# Patient Record
Sex: Female | Born: 1960 | Race: White | Hispanic: No | Smoking: Former smoker
Health system: Southern US, Community
[De-identification: ages and names within clinical notes are randomized; demographics above are authoritative.]

## PROBLEM LIST (undated history)

## (undated) DIAGNOSIS — I639 Cerebral infarction, unspecified: Secondary | ICD-10-CM

## (undated) DIAGNOSIS — Z87442 Personal history of urinary calculi: Secondary | ICD-10-CM

## (undated) DIAGNOSIS — G43909 Migraine, unspecified, not intractable, without status migrainosus: Secondary | ICD-10-CM

## (undated) DIAGNOSIS — A159 Respiratory tuberculosis unspecified: Secondary | ICD-10-CM

## (undated) DIAGNOSIS — G4730 Sleep apnea, unspecified: Secondary | ICD-10-CM

## (undated) DIAGNOSIS — F419 Anxiety disorder, unspecified: Secondary | ICD-10-CM

## (undated) DIAGNOSIS — Z800 Family history of malignant neoplasm of digestive organs: Secondary | ICD-10-CM

## (undated) DIAGNOSIS — J189 Pneumonia, unspecified organism: Secondary | ICD-10-CM

## (undated) DIAGNOSIS — M1990 Unspecified osteoarthritis, unspecified site: Secondary | ICD-10-CM

## (undated) DIAGNOSIS — Z803 Family history of malignant neoplasm of breast: Secondary | ICD-10-CM

## (undated) DIAGNOSIS — I10 Essential (primary) hypertension: Secondary | ICD-10-CM

## (undated) DIAGNOSIS — I499 Cardiac arrhythmia, unspecified: Secondary | ICD-10-CM

## (undated) HISTORY — DX: Family history of malignant neoplasm of digestive organs: Z80.0

## (undated) HISTORY — DX: Cerebral infarction, unspecified: I63.9

## (undated) HISTORY — PX: KNEE ARTHROSCOPY: SHX127

## (undated) HISTORY — PX: ABDOMINAL HYSTERECTOMY: SHX81

## (undated) HISTORY — PX: WISDOM TOOTH EXTRACTION: SHX21

## (undated) HISTORY — DX: Family history of malignant neoplasm of breast: Z80.3

## (undated) HISTORY — PX: CYST EXCISION: SHX5701

---

## 1999-03-25 ENCOUNTER — Other Ambulatory Visit: Admission: RE | Admit: 1999-03-25 | Discharge: 1999-03-25 | Payer: Self-pay | Admitting: *Deleted

## 1999-06-02 ENCOUNTER — Other Ambulatory Visit: Admission: RE | Admit: 1999-06-02 | Discharge: 1999-06-02 | Payer: Self-pay | Admitting: *Deleted

## 2000-03-26 ENCOUNTER — Encounter (INDEPENDENT_AMBULATORY_CARE_PROVIDER_SITE_OTHER): Payer: Self-pay | Admitting: Specialist

## 2000-03-26 ENCOUNTER — Other Ambulatory Visit: Admission: RE | Admit: 2000-03-26 | Discharge: 2000-03-26 | Payer: Self-pay | Admitting: *Deleted

## 2000-12-13 ENCOUNTER — Other Ambulatory Visit: Admission: RE | Admit: 2000-12-13 | Discharge: 2000-12-13 | Payer: Self-pay | Admitting: Obstetrics & Gynecology

## 2001-01-04 ENCOUNTER — Observation Stay (HOSPITAL_COMMUNITY): Admission: RE | Admit: 2001-01-04 | Discharge: 2001-01-05 | Payer: Self-pay | Admitting: Obstetrics & Gynecology

## 2001-01-04 ENCOUNTER — Encounter (INDEPENDENT_AMBULATORY_CARE_PROVIDER_SITE_OTHER): Payer: Self-pay | Admitting: Specialist

## 2001-01-21 ENCOUNTER — Inpatient Hospital Stay (HOSPITAL_COMMUNITY): Admission: AD | Admit: 2001-01-21 | Discharge: 2001-01-21 | Payer: Self-pay | Admitting: Obstetrics and Gynecology

## 2001-02-04 ENCOUNTER — Ambulatory Visit (HOSPITAL_COMMUNITY): Admission: RE | Admit: 2001-02-04 | Discharge: 2001-02-04 | Payer: Self-pay | Admitting: Internal Medicine

## 2002-01-02 ENCOUNTER — Ambulatory Visit (HOSPITAL_COMMUNITY): Admission: RE | Admit: 2002-01-02 | Discharge: 2002-01-02 | Payer: Self-pay | Admitting: General Surgery

## 2002-10-03 ENCOUNTER — Ambulatory Visit (HOSPITAL_COMMUNITY): Admission: RE | Admit: 2002-10-03 | Discharge: 2002-10-03 | Payer: Self-pay | Admitting: Internal Medicine

## 2002-10-03 ENCOUNTER — Encounter: Payer: Self-pay | Admitting: Internal Medicine

## 2003-07-11 ENCOUNTER — Ambulatory Visit (HOSPITAL_COMMUNITY): Admission: RE | Admit: 2003-07-11 | Discharge: 2003-07-11 | Payer: Self-pay | Admitting: Family Medicine

## 2003-08-16 ENCOUNTER — Ambulatory Visit (HOSPITAL_COMMUNITY): Admission: RE | Admit: 2003-08-16 | Discharge: 2003-08-16 | Payer: Self-pay | Admitting: Orthopedic Surgery

## 2003-08-22 ENCOUNTER — Encounter (HOSPITAL_COMMUNITY): Admission: RE | Admit: 2003-08-22 | Discharge: 2003-08-31 | Payer: Self-pay | Admitting: Orthopedic Surgery

## 2003-09-04 ENCOUNTER — Encounter (HOSPITAL_COMMUNITY): Admission: RE | Admit: 2003-09-04 | Discharge: 2003-10-04 | Payer: Self-pay | Admitting: Orthopedic Surgery

## 2003-10-05 ENCOUNTER — Encounter (HOSPITAL_COMMUNITY): Admission: RE | Admit: 2003-10-05 | Discharge: 2003-11-04 | Payer: Self-pay | Admitting: Orthopedic Surgery

## 2003-11-05 ENCOUNTER — Encounter (HOSPITAL_COMMUNITY): Admission: RE | Admit: 2003-11-05 | Discharge: 2003-12-05 | Payer: Self-pay | Admitting: Orthopedic Surgery

## 2003-12-06 ENCOUNTER — Encounter (HOSPITAL_COMMUNITY): Admission: RE | Admit: 2003-12-06 | Discharge: 2004-01-05 | Payer: Self-pay | Admitting: Orthopedic Surgery

## 2004-09-03 ENCOUNTER — Encounter (HOSPITAL_COMMUNITY): Admission: RE | Admit: 2004-09-03 | Discharge: 2004-12-02 | Payer: Self-pay | Admitting: Neurology

## 2005-08-15 ENCOUNTER — Emergency Department: Payer: Self-pay | Admitting: Emergency Medicine

## 2005-08-17 ENCOUNTER — Ambulatory Visit: Payer: Self-pay | Admitting: Orthopedic Surgery

## 2005-08-17 ENCOUNTER — Ambulatory Visit (HOSPITAL_COMMUNITY): Admission: RE | Admit: 2005-08-17 | Discharge: 2005-08-17 | Payer: Self-pay | Admitting: Orthopedic Surgery

## 2005-10-02 ENCOUNTER — Encounter (HOSPITAL_COMMUNITY): Admission: RE | Admit: 2005-10-02 | Discharge: 2005-11-01 | Payer: Self-pay | Admitting: Specialist

## 2005-11-02 ENCOUNTER — Encounter (HOSPITAL_COMMUNITY): Admission: RE | Admit: 2005-11-02 | Discharge: 2005-12-02 | Payer: Self-pay | Admitting: Specialist

## 2005-12-03 ENCOUNTER — Encounter (HOSPITAL_COMMUNITY): Admission: RE | Admit: 2005-12-03 | Discharge: 2006-01-02 | Payer: Self-pay | Admitting: Specialist

## 2006-01-05 ENCOUNTER — Encounter (HOSPITAL_COMMUNITY): Admission: RE | Admit: 2006-01-05 | Discharge: 2006-01-28 | Payer: Self-pay | Admitting: Specialist

## 2006-01-29 ENCOUNTER — Encounter (HOSPITAL_COMMUNITY): Admission: RE | Admit: 2006-01-29 | Discharge: 2006-02-28 | Payer: Self-pay | Admitting: Specialist

## 2006-03-02 ENCOUNTER — Encounter (HOSPITAL_COMMUNITY): Admission: RE | Admit: 2006-03-02 | Discharge: 2006-04-01 | Payer: Self-pay | Admitting: Specialist

## 2009-03-05 ENCOUNTER — Ambulatory Visit (HOSPITAL_COMMUNITY): Admission: RE | Admit: 2009-03-05 | Discharge: 2009-03-05 | Payer: Self-pay | Admitting: Internal Medicine

## 2009-07-22 ENCOUNTER — Ambulatory Visit (HOSPITAL_COMMUNITY): Admission: RE | Admit: 2009-07-22 | Discharge: 2009-07-22 | Payer: Self-pay | Admitting: Family Medicine

## 2009-08-09 ENCOUNTER — Ambulatory Visit (HOSPITAL_COMMUNITY): Admission: RE | Admit: 2009-08-09 | Discharge: 2009-08-09 | Payer: Self-pay | Admitting: General Surgery

## 2010-12-11 IMAGING — CR DG ANKLE COMPLETE 3+V*R*
2 series · 2 of 2 positions shown · non-contrast
Comparison: None

CLINICAL DATA: Twisted ankle 3 weeks ago with continued pain and
swelling

RIGHT ANKLE - COMPLETE 3+ VIEW

[view not recorded (1 of 2)]
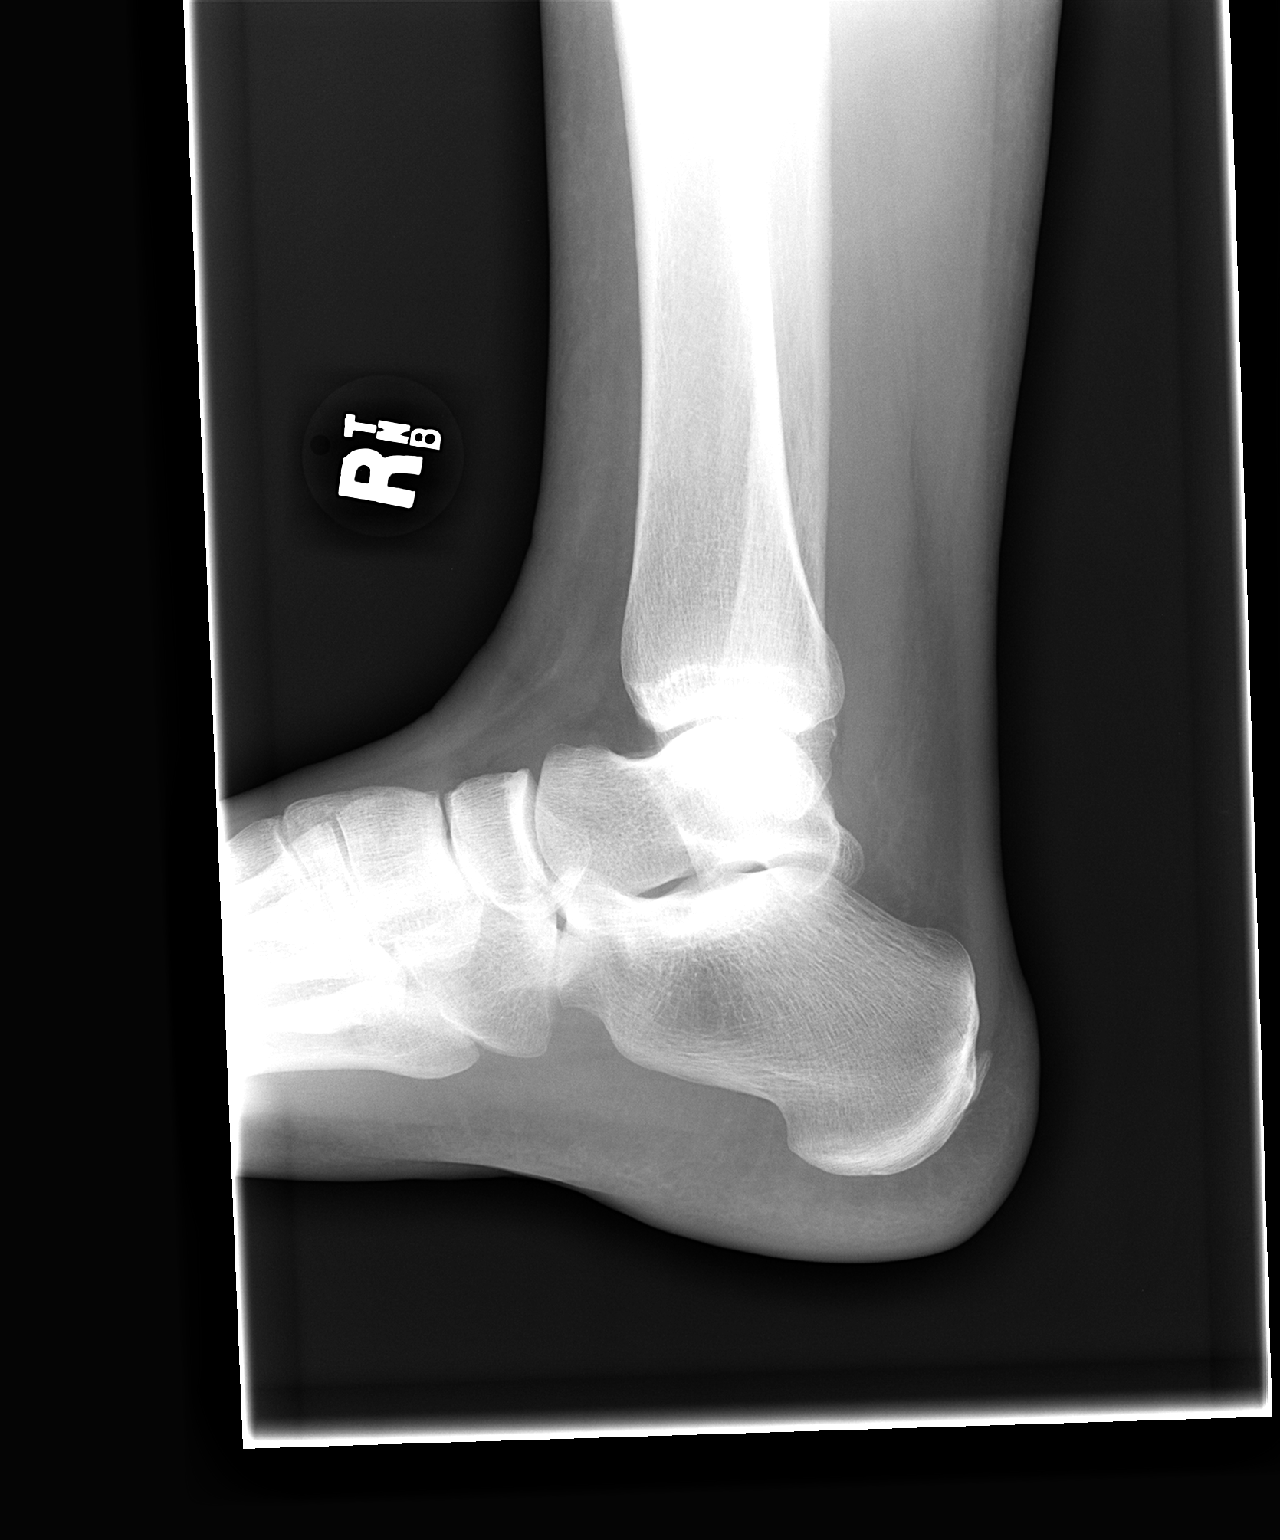

[view not recorded (2 of 2)]
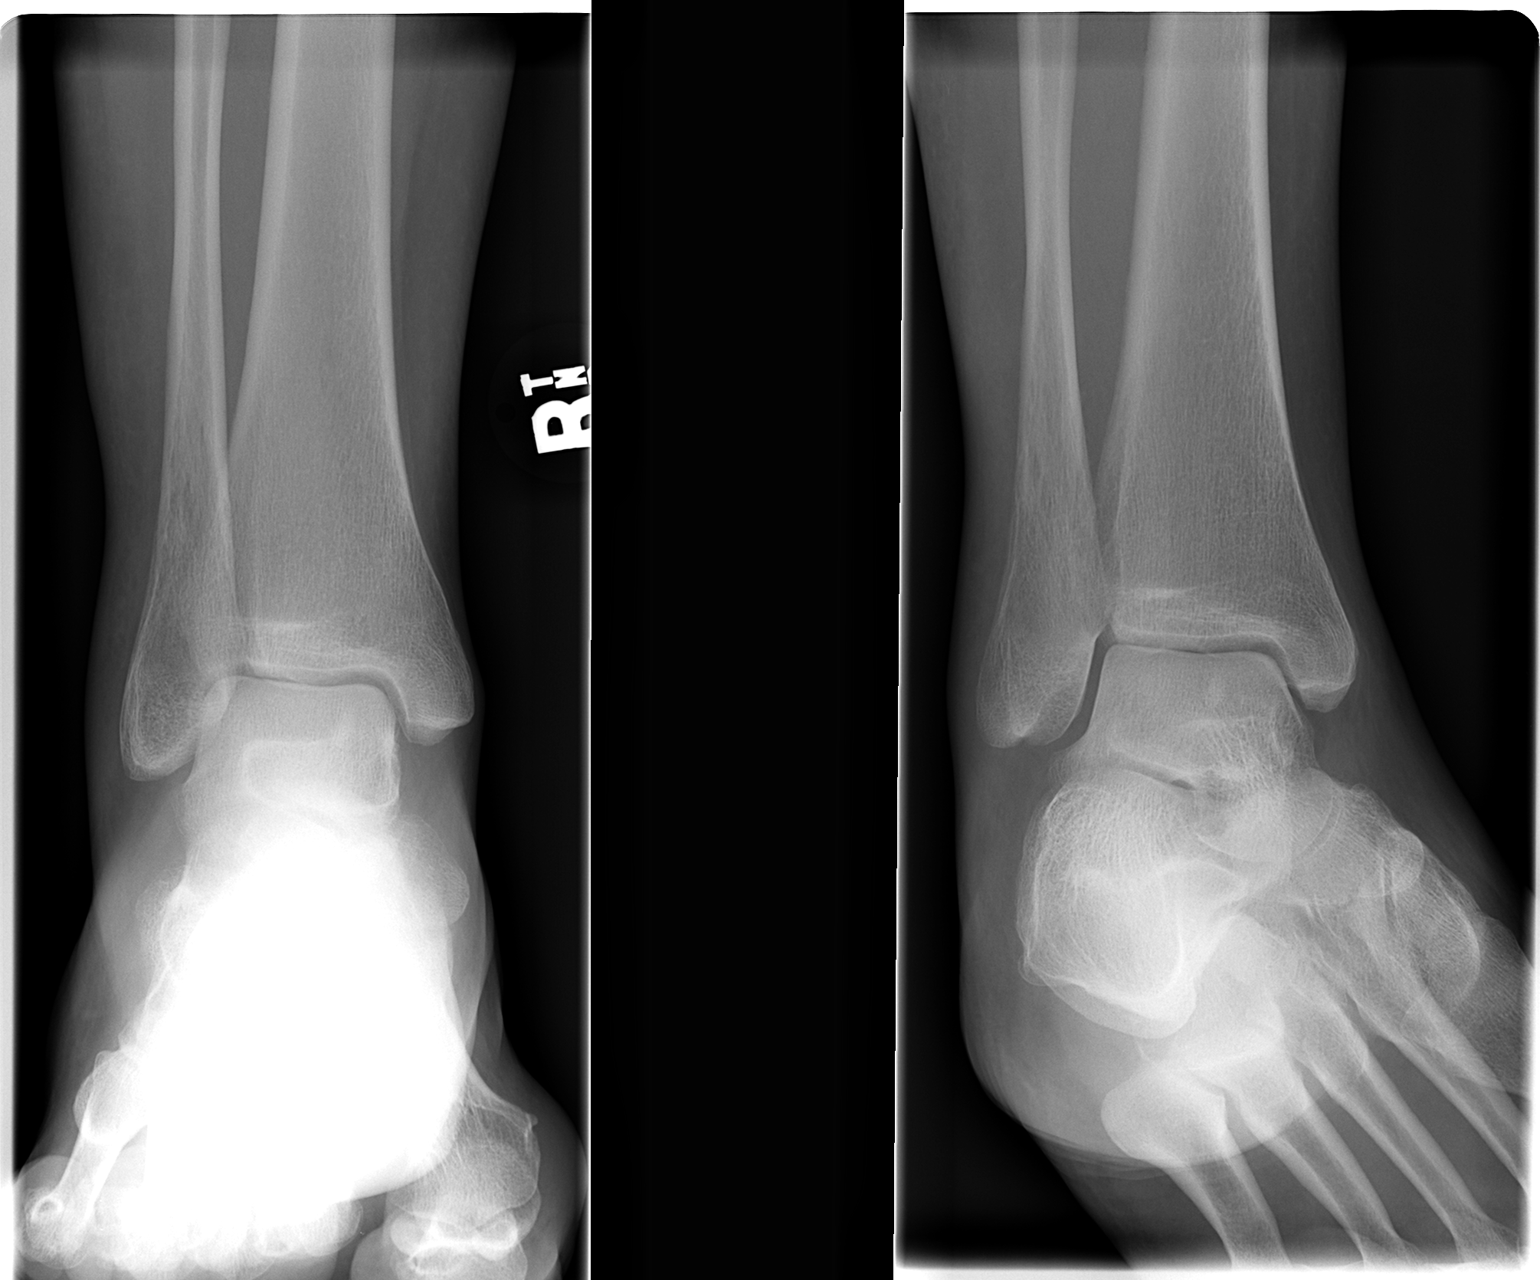

[2 of 2 positions shown; findings below may reference images not displayed]

FINDINGS: The ankle joint appears normal.  No acute fracture is
seen.  Alignment is normal.  No joint effusion is seen.
IMPRESSION: Negative right ankle.

## 2011-01-16 NOTE — Op Note (Signed)
NAME:  CHIAMAKA, LATKA                       ACCOUNT NO.:  [id number]   MEDICAL RECORD NO.:  [id number]                   PATIENT TYPE:  OUT   LOCATION:  RAD                                  FACILITY:  [facility]   PHYSICIAN:  Vickki Hearing, M.D.           DATE OF BIRTH:  [birth year]   DATE OF PROCEDURE:  DATE OF DISCHARGE:  07/11/2003                                 OPERATIVE REPORT   CHIEF COMPLAINT:  Left knee pain.   HISTORY:  Angela Wallace is 50 years old.  She twisted her left knee in late  October.  She complained of medial and posterior knee pain and was treated  conservatively with anti-inflammatories, ice, rest, bracing, mild analgesic,  and had mild improvement.  She had an MRI which showed a grade 2 lesion of  the medial meniscus, grade 3 chondromalacia of the patella, and increased  signal changes in the lateral gastrocs.  After six weeks of conservative  treatment, she has not improved to her premorbid state and has agreed to  arthroscopy of the left knee.   MEDICAL SYSTEM REVIEW:  She had pneumonia in 2003.  She has a history of  migraines.  She wears glasses.  She has seasonal allergies.  Other systems  reviewed general, cardiovascular, gastrointestinal, urinary, endocrine,  psychiatric, skin, lymphatics negative/normal.   ALLERGIES:  CODEINE.   MEDICAL PROBLEMS:  Only listed migraine headaches.   PREVIOUS SURGERY:  Partial hysterectomy, and a cyst was removed as well.   PHARMACY:  [employer] information [employer].   MEDICATIONS:  1. Reglan on a p.r.n. basis.  2. Ambien on a p.r.n. basis.  3. Lorcet on a p.r.n. basis.  4. Hydrochlorothiazide 25 mg daily.  5. Diclofenac twice a day.   FAMILY HISTORY:  Kidney disease, arthritis, and cancer.   FAMILY PHYSICIAN:  Kirk Ruths, M.D.   MARITAL HISTORY:  Married but separated.   EMPLOYMENT:  Customer service.   SOCIAL HABITS:  Smoking:  One-half pack per day.  Alcohol use:  Rare.  Caffeine use:  Yes.   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   PHYSICAL EXAMINATION:  VITAL SIGNS:  Pulse 74, respiratory rate 20, weight  210.  GENERAL:  Well-developed, well-nourished.  Groom and hygiene normal.  NECK:  No deformities or lymph nodes.  Normal cervical spine.  CARDIOVASCULAR:  No swelling or varicose veins.  EXTREMITIES:  Pulses normal, temperature normal.  No edema or tenderness.  NEUROLOGIC/PSYCHIATRIC:  Exam shows normal sensation, coordination, and  reflexes.  She was alert and oriented x3.  Mood and affect was normal.  SKIN:  Normal in all four extremities.  MUSCULOSKELETAL:  Exam showed a mild limp noted in the left lower extremity.  She was wearing a brace.  With the brace removed, she had a small joint  effusion.  Range of motion was 0 to 110.  The ligaments were stable.  The  muscle strength  and tone was normal without tremor.  There were joint  contractures or subluxation.  Her meniscal signs were as follows:  She had  some mild medial joint line tenderness.  She had pain with rotation of the  knee.  Screw-home or extension test was normal.  Her other extremities were  unremarkable.   DATA REVIEWED:  MRI of the left knee.  The findings are noted in the history  of present illness section.   DIAGNOSIS:  Early osteoarthritis of the knee, medial meniscal tear, left  knee; pain, left knee; possible gastroc tear, left knee.   PLAN:  Arthroscopy, medial meniscectomy, chondroplasty as needed, left knee.   CPT CODE:  16109.  ICD9 code:  717.2, 715.16, 719.46.      ___________________________________________                                            Vickki Hearing, M.D.   SEH/MEDQ  D:  08/07/2003  T:  08/08/2003  Job:  [id number]

## 2011-01-16 NOTE — Op Note (Signed)
NAME:  [name], [name]                       ACCOUNT NO.:  [id number]   MEDICAL RECORD NO.:  [id number]                   PATIENT TYPE:  AMB   LOCATION:  DAY                                  FACILITY:  [facility]   PHYSICIAN:  Vickki Hearing, M.D.           DATE OF BIRTH:  1961-02-22   DATE OF PROCEDURE:  08/16/2003  DATE OF DISCHARGE:                                 OPERATIVE REPORT   DICTATION DATE:  08/16/2003   PROCEDURE DATE:  08/16/2003   INDICATIONS FOR PROCEDURE:  Angela Wallace is a 50 year old female who  sustained a left knee injury.  She was treated conservatively.  An MRI was  obtained that showed a grade 2 signal of the medial meniscus and some  articular surface irregularity of the medial femoral condyle.  After she  continued to have pain and swelling, we finally decided to go ahead and  perform arthroscopy of the left knee.   PREOPERATIVE DIAGNOSES:  1. Torn medial meniscus, left knee.  2. Chondral lesion, left medial femoral condyle.  3. Osteoarthritis, left knee.   POSTOPERATIVE DIAGNOSES:  1. Chondral flap tear, medial femoral condyle.  2. Medial meniscus tear, medial compartment.  3. Patellofemoral joint arthrosis.   OPERATIVE FINDINGS:  There was a chondral flap tear the size of a quarter or  larger of the medial femoral condyle.  It was loose and causing a mechanical  block to the knee.  There was a horizontal cleavage tear of the medial  meniscus.  There was a dime-size defect in the trochlea and fibrillation of  the patellofemoral cartilage.   ANESTHESIA:  LMA general.   SURGEON:  Vickki Hearing, M.D.   DESCRIPTION OF PROCEDURE:  The patient was identified in the holding area by  arm band.  I reviewed her medical record.  Medical record indicated by  consent and history and orders left knee arthroscopy with torn medial  meniscus as the diagnosis as well as osteoarthritis and cartilage lesion of  the knee.  She placed a mark over the left  knee.  I signed my initials over  it.  We gave her a gram of Ancef on the way back to the operating room, and  she had an LMA general anesthetic.  She was placed on the operating table  supine.  Exam under anesthesia was performed.  No new findings were noted.   After sterile prep and drape, we took a time out.  We confirmed that the  patient was Angela Wallace and that she was to have a left knee arthroscopy  and possible medial meniscectomy and chondroplasty and that the limb for  surgery was the left lower extremity.  After this was completed, we  continued the draping and did a standard three-portal technique diagnostic  arthroscopy.   The findings are noted above.  Through a medial portal, we resected the  chondral flap.  We debrided the base and the rim  to a stable configuration.  We took a straight and curved duckbill forceps and resected the meniscal  tear.  Using a 4-0 aggressive meniscal shaver, we balanced the meniscus and  cleaned the debris from the joint.  We took a chondral pick and  microfractured the base of the lesion.  We put the shaver back in to smooth  it back out.  We placed the knee in extension and performed a chondroplasty  of the trochlea and patella.  We used the arthroscopic ArthroCare wand to  contour the meniscus and to seal the cartilage horizontal cleavage.   The knee was then irrigated and suctioned dry.  Hemostasis was obtained.  Steri-Strips were placed over the portal sites.  We injected 30 cc of  Marcaine and put sterile dressings on the knee along with a cryopad, placed  an Ace bandage, extubated the patient and took her to the recovery room in  stable condition.   POSTOPERATIVE PLAN:  Nonweightbearing for six weeks.  Active range of motion  starting on the 20th.  Follow up on the 20th.      ___________________________________________                                            Vickki Hearing, M.D.   SEH/MEDQ  D:  08/16/2003  T:   08/17/2003  Job:  [id number]

## 2011-01-16 NOTE — Op Note (Signed)
[facility]  Patient:    Angela Wallace, Angela Wallace                    MRN: [id number] Proc. Date: 01/04/01 Adm. Date:  [id number] Attending:  Lars Pinks                           Operative Report  PREOPERATIVE DIAGNOSIS:       Menometrorrhagia.  POSTOPERATIVE DIAGNOSIS:      Menometrorrhagia.  OPERATION:                    Total transvaginal hysterectomy.  SURGEON:                      Richard D. Arlyce Dice, M.D.  ASSISTANT:                    Luvenia Redden, M.D.  ANESTHESIA:                   General.  ESTIMATED BLOOD LOSS:         150 cc.  FINDINGS:                     A normal-appearing uterus.  INDICATIONS:                  This is a 50 year old gravida 2, para 2, who has had persistent abnormal heavy and regular menses.  The patient had severe migraine headaches, and the headaches are worse during her period.  These have probably not been controlled with standard hormonal or anti-headache remedies, and because of the severity of the problem, the decision was made to proceed with hysterectomy.  DESCRIPTION OF PROCEDURE:     The patient was taken to the operating room and placed in the dorsal supine position, and general anesthesia was induced using a _______ technique.  The patient was then placed in the dorsolithotomy position, and the vagina and perineum were prepped and draped in a sterile fashion.  The cervix was grasped with a Christella Hartigan tenaculum.  The anterior and posterior vagina was then incised at the reflection of the bladder flap anteriorly and the cul-de-sac posteriorly.  The anterior cul-de-sac was then entered sharply and the posterior cul-de-sac was entered as well.  The uterosacral ligaments were clamped with a Heaney clamp, cut, and ligated with a transfixion suture.  The LigaSure coagulator was then used to clamp and coagulate the cardinal ligaments to the cut and the uterine ligaments which were then cut.  Another bite at  the base of the broad ligament was also taken. This procedure was done on both sides.  The uterus was then delivered posteriorly.  The LigaSure was then used to coagulate the utero-ovarian anastomosis and this was cut and the uterus was free.  Hemostasis was noted to be present.  Wound bleeding at the left utero-ovarian anastomosis was clamped and ligated.  The field was then inspected and noted to be hemostatic.  The posterior vaginal cuff was then _______ closed with a running 0 Vicryl suture.  The anterior cuff of the peritoneum was closed with a pursestring suture and the anterior vaginal cuff was closed with figure-of-eight sutures.  The procedure was then terminated and the patient left the operating room in good condition. DD:  01/04/01 TD:  01/05/01 Job: [id number]

## 2011-04-14 ENCOUNTER — Ambulatory Visit (HOSPITAL_COMMUNITY)
Admission: RE | Admit: 2011-04-14 | Discharge: 2011-04-14 | Disposition: A | Discharge status: Home / Self Care | Payer: BC Managed Care – PPO | Source: Ambulatory Visit | Attending: Family Medicine | Admitting: Family Medicine

## 2011-04-14 ENCOUNTER — Other Ambulatory Visit (HOSPITAL_COMMUNITY): Payer: Self-pay | Admitting: Family Medicine

## 2011-04-14 DIAGNOSIS — S93409A Sprain of unspecified ligament of unspecified ankle, initial encounter: Secondary | ICD-10-CM

## 2011-04-14 DIAGNOSIS — M25579 Pain in unspecified ankle and joints of unspecified foot: Secondary | ICD-10-CM

## 2011-04-14 DIAGNOSIS — S8990XA Unspecified injury of unspecified lower leg, initial encounter: Secondary | ICD-10-CM | POA: Insufficient documentation

## 2011-04-14 DIAGNOSIS — W19XXXA Unspecified fall, initial encounter: Secondary | ICD-10-CM | POA: Insufficient documentation

## 2011-04-29 IMAGING — US US ABDOMEN COMPLETE
1 series · 14 of 25 positions shown · non-contrast
Comparison: None

CLINICAL DATA: Abdominal pain

ABDOMINAL ULTRASOUND COMPLETE

[Series 1: unknown · 0.33mm/px · 14 of 75 slices shown]
[im 1/75]
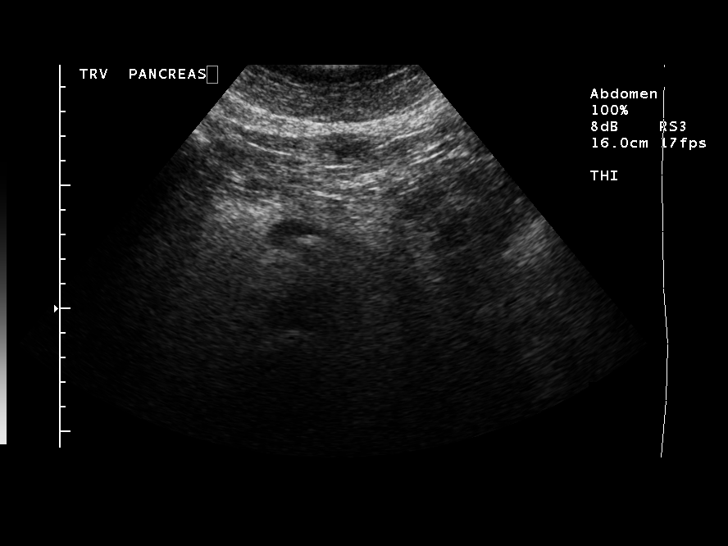
[im 7/75]
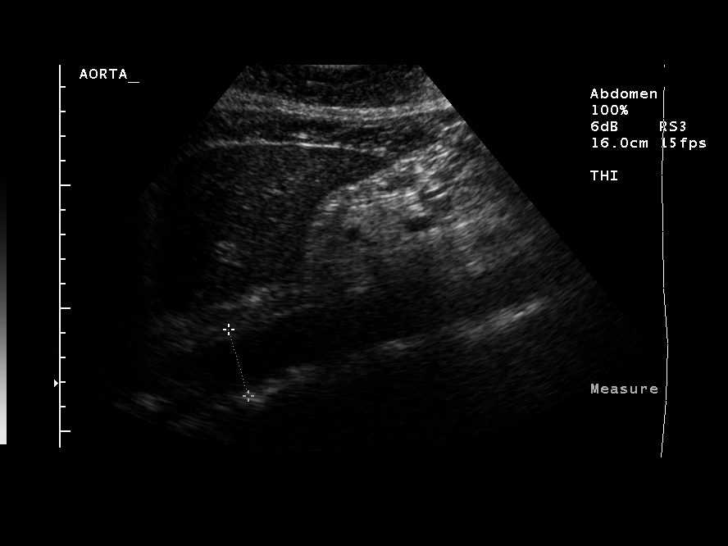
[im 13/75]
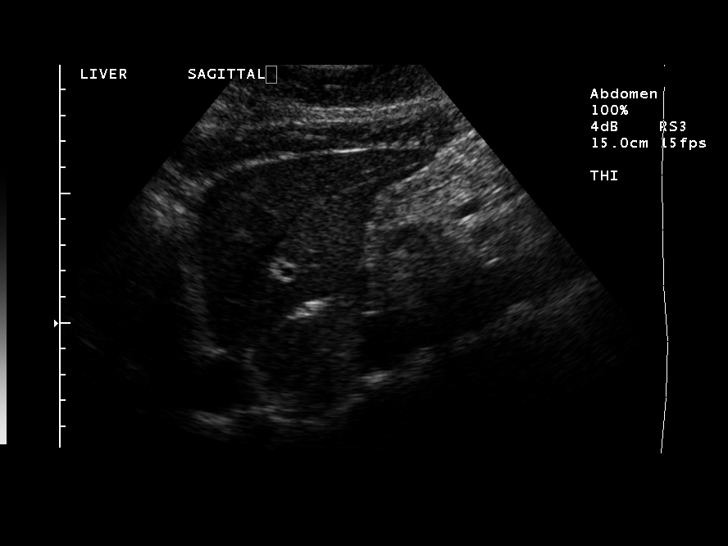
[im 19/75]
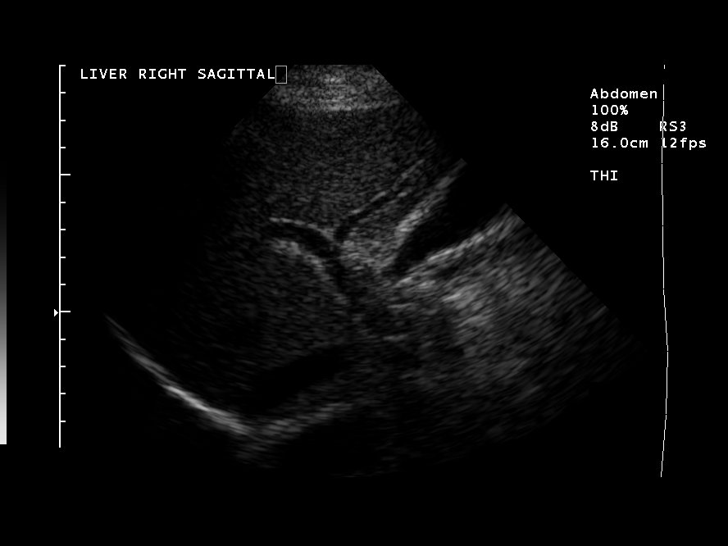
[im 25/75]
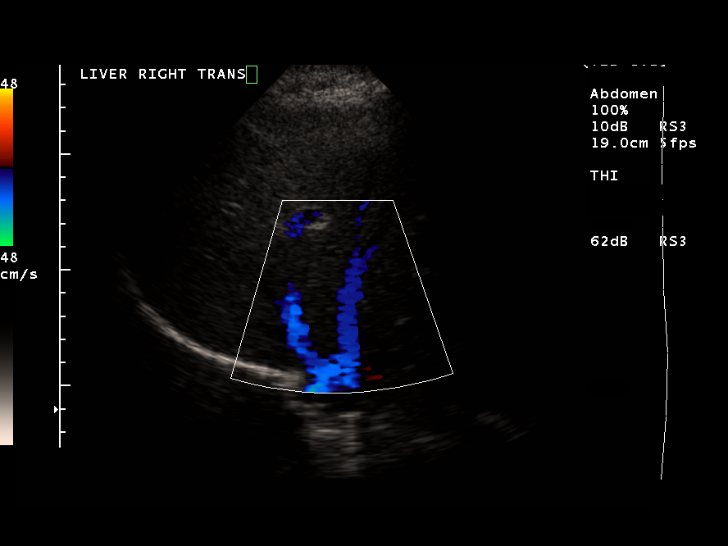
[im 28/75]
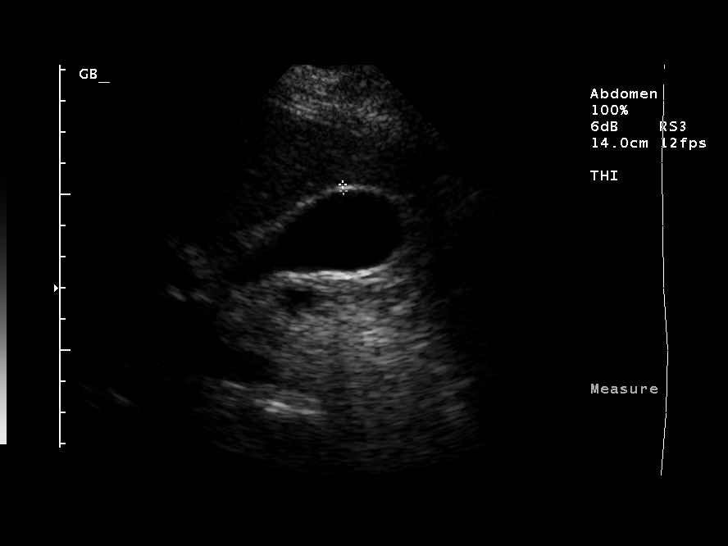
[im 34/75]
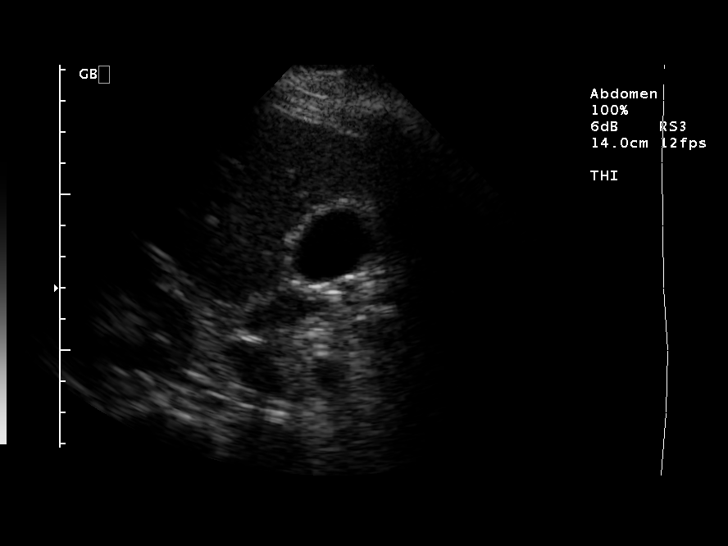
[im 41/75]
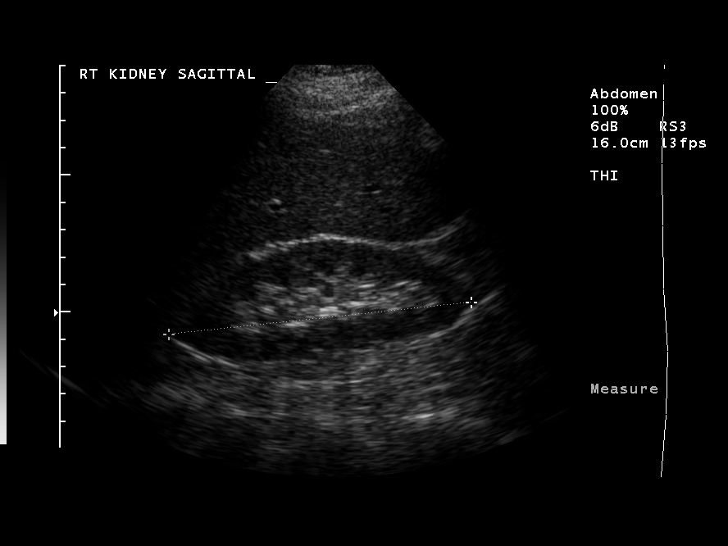
[im 47/75]
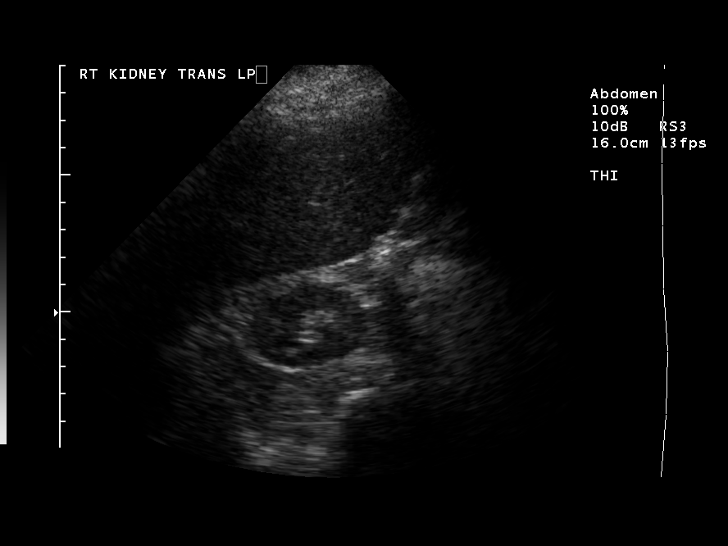
[im 50/75]
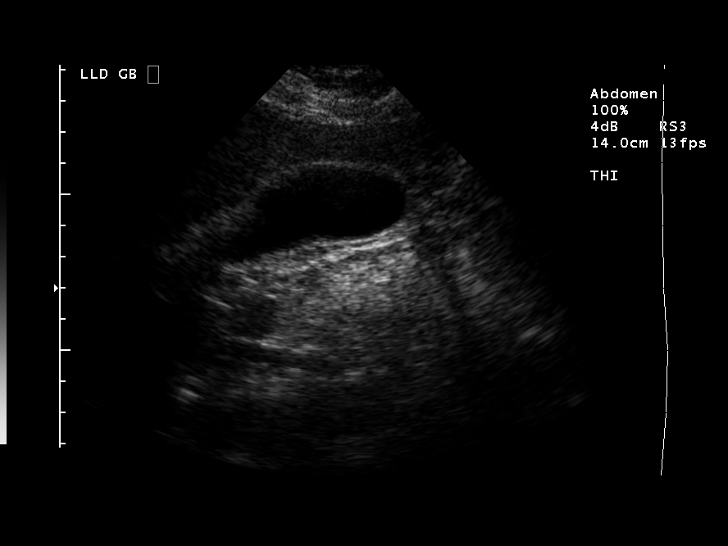
[im 56/75]
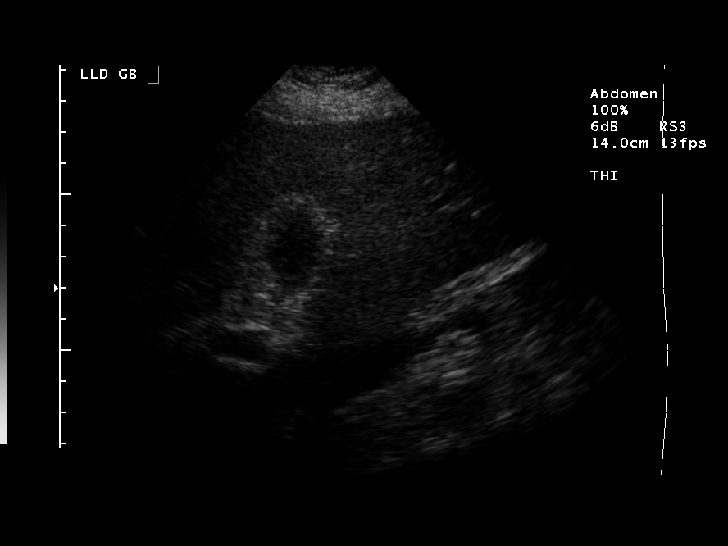
[im 62/75]
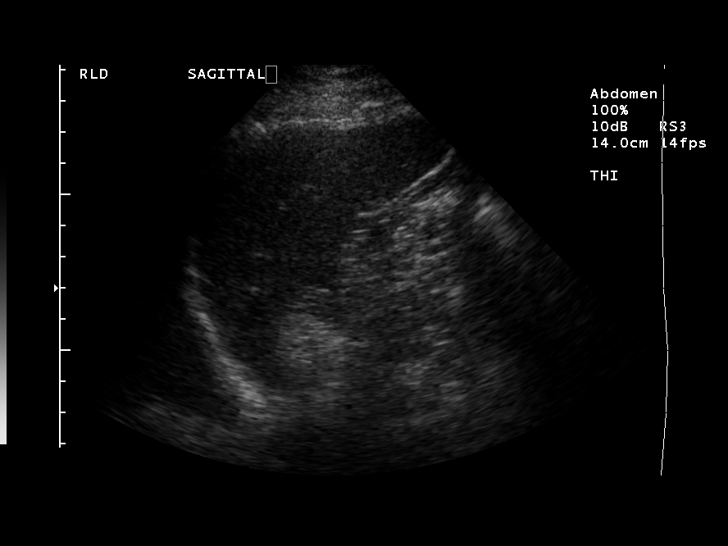
[im 68/75]
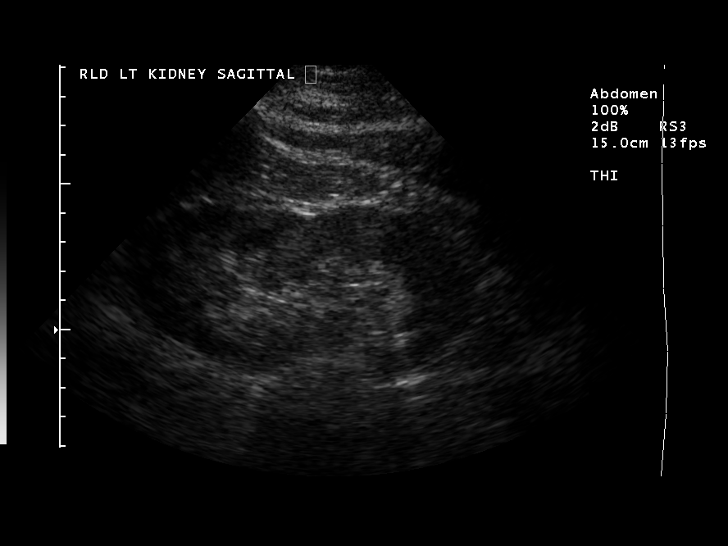
[im 75/75]
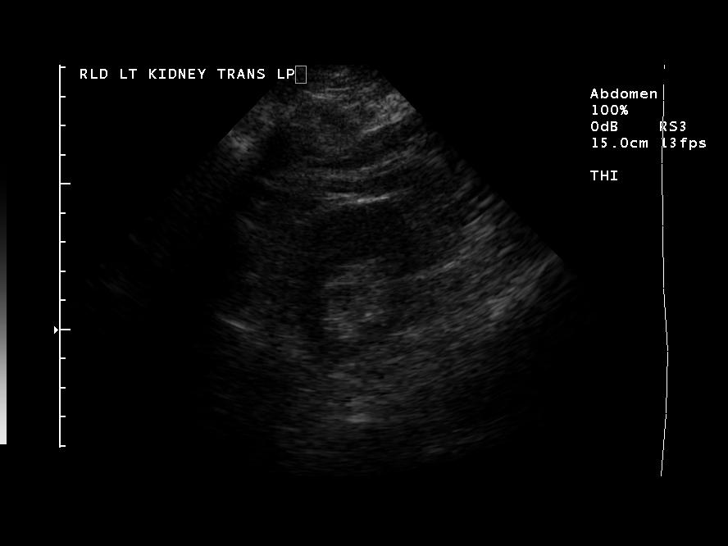

[14 of 25 positions shown; findings below may reference images not displayed]

FINDINGS: Gallbladder:  No gallstones, gallbladder wall thickening, or
pericholecystic fluid.

Common Bile Duct:  Within normal limits in caliber.

Liver:  No focal lesion identified.  Within normal limits in
parenchymal echogenicity.

IVC:  Appears normal.

Pancreas:  Although the pancreas is difficult to visualize in its
entirety, no focal pancreatic abnormality is identified.

Spleen:  Within normal limits in size and echotexture.

Right kidney:  Normal in size and parenchymal echogenicity.  No
evidence of mass or hydronephrosis.

Left kidney:  Normal in size and parenchymal echogenicity.  No
evidence of mass or hydronephrosis.

Abdominal Aorta:  No aneurysm identified.
IMPRESSION: Negative abdominal ultrasound.

## 2014-04-17 ENCOUNTER — Ambulatory Visit (HOSPITAL_COMMUNITY)
Admission: RE | Admit: 2014-04-17 | Discharge: 2014-04-17 | Disposition: A | Discharge status: Home / Self Care | Payer: BC Managed Care – PPO | Source: Ambulatory Visit | Attending: Family Medicine | Admitting: Family Medicine

## 2014-04-17 ENCOUNTER — Other Ambulatory Visit (HOSPITAL_COMMUNITY): Payer: Self-pay | Admitting: Family Medicine

## 2014-04-17 DIAGNOSIS — M25529 Pain in unspecified elbow: Secondary | ICD-10-CM | POA: Insufficient documentation

## 2014-04-17 DIAGNOSIS — W19XXXA Unspecified fall, initial encounter: Secondary | ICD-10-CM

## 2015-06-13 ENCOUNTER — Other Ambulatory Visit (HOSPITAL_COMMUNITY): Payer: Self-pay | Admitting: Physician Assistant

## 2015-06-13 DIAGNOSIS — Z1231 Encounter for screening mammogram for malignant neoplasm of breast: Secondary | ICD-10-CM

## 2015-06-19 ENCOUNTER — Ambulatory Visit (HOSPITAL_COMMUNITY): Payer: Self-pay

## 2015-07-05 ENCOUNTER — Ambulatory Visit (HOSPITAL_COMMUNITY): Payer: Self-pay

## 2015-07-05 ENCOUNTER — Other Ambulatory Visit (HOSPITAL_COMMUNITY): Payer: Self-pay | Admitting: Physician Assistant

## 2015-07-05 DIAGNOSIS — Z1231 Encounter for screening mammogram for malignant neoplasm of breast: Secondary | ICD-10-CM

## 2015-07-22 ENCOUNTER — Ambulatory Visit (HOSPITAL_COMMUNITY)
Admission: RE | Admit: 2015-07-22 | Discharge: 2015-07-22 | Disposition: A | Discharge status: Home / Self Care | Payer: Commercial Managed Care - HMO | Source: Ambulatory Visit | Attending: Physician Assistant | Admitting: Physician Assistant

## 2015-07-22 DIAGNOSIS — Z1231 Encounter for screening mammogram for malignant neoplasm of breast: Secondary | ICD-10-CM | POA: Diagnosis not present

## 2015-07-22 DIAGNOSIS — R928 Other abnormal and inconclusive findings on diagnostic imaging of breast: Secondary | ICD-10-CM | POA: Insufficient documentation

## 2015-07-29 ENCOUNTER — Other Ambulatory Visit: Payer: Self-pay | Admitting: Physician Assistant

## 2015-07-29 DIAGNOSIS — R928 Other abnormal and inconclusive findings on diagnostic imaging of breast: Secondary | ICD-10-CM

## 2015-08-02 ENCOUNTER — Ambulatory Visit
Admission: RE | Admit: 2015-08-02 | Discharge: 2015-08-02 | Disposition: A | Discharge status: Home / Self Care | Payer: Commercial Managed Care - HMO | Source: Ambulatory Visit | Attending: Physician Assistant | Admitting: Physician Assistant

## 2015-08-02 ENCOUNTER — Other Ambulatory Visit: Payer: Self-pay | Admitting: Physician Assistant

## 2015-08-02 ENCOUNTER — Ambulatory Visit
Admission: RE | Admit: 2015-08-02 | Discharge: 2015-08-02 | Disposition: A | Discharge status: Home / Self Care | Payer: PRIVATE HEALTH INSURANCE | Source: Ambulatory Visit | Attending: Physician Assistant | Admitting: Physician Assistant

## 2015-08-02 DIAGNOSIS — R928 Other abnormal and inconclusive findings on diagnostic imaging of breast: Secondary | ICD-10-CM

## 2016-06-15 ENCOUNTER — Other Ambulatory Visit (HOSPITAL_COMMUNITY): Payer: Self-pay | Admitting: Registered Nurse

## 2016-06-15 ENCOUNTER — Ambulatory Visit (HOSPITAL_COMMUNITY)
Admission: RE | Admit: 2016-06-15 | Discharge: 2016-06-15 | Disposition: A | Discharge status: Home / Self Care | Payer: PRIVATE HEALTH INSURANCE | Source: Ambulatory Visit | Attending: Registered Nurse | Admitting: Registered Nurse

## 2016-06-15 DIAGNOSIS — Z8781 Personal history of (healed) traumatic fracture: Secondary | ICD-10-CM | POA: Insufficient documentation

## 2016-06-15 DIAGNOSIS — M25511 Pain in right shoulder: Secondary | ICD-10-CM | POA: Diagnosis present

## 2016-06-15 DIAGNOSIS — M79601 Pain in right arm: Secondary | ICD-10-CM

## 2016-06-15 DIAGNOSIS — M19011 Primary osteoarthritis, right shoulder: Secondary | ICD-10-CM | POA: Insufficient documentation

## 2016-06-29 ENCOUNTER — Ambulatory Visit (INDEPENDENT_AMBULATORY_CARE_PROVIDER_SITE_OTHER): Payer: 59 | Admitting: Orthopedic Surgery

## 2016-06-29 ENCOUNTER — Encounter: Payer: Self-pay | Admitting: Orthopedic Surgery

## 2016-06-29 VITALS — BP 129/92 | HR 84 | Wt 235.0 lb

## 2016-06-29 DIAGNOSIS — M75101 Unspecified rotator cuff tear or rupture of right shoulder, not specified as traumatic: Secondary | ICD-10-CM | POA: Diagnosis not present

## 2016-06-29 NOTE — Patient Instructions (Signed)

## 2016-06-29 NOTE — Progress Notes (Signed)
Chief Complaint  Patient presents with  . Shoulder Pain    right shoulder pain   HPI   This is a 55 year old female presents with 4 week history of right shoulder pain. We don't know of any significant recent trauma. She did have a proximal humerus fracture treated with nonoperative treatment which resulted in apex anterior deformity but she recovered well with physical therapy and regain full use of the arm  She may have yanked a suitcase off of the suitcase area at the airport as she does travel a lot but no definite traumatic event  She did use positive Advil presents for evaluation of right shoulder pain dull ache appears to be moderate in severity of 4 weeks duration  Review of Systems  Constitutional: Negative for chills, fever and weight loss.  Respiratory: Negative for shortness of breath.   Cardiovascular: Negative for chest pain.  Neurological: Positive for tingling.    No past medical history on file.  No past surgical history on file. No family history on file. Social History  Substance Use Topics  . Smoking status: Never Smoker  . Smokeless tobacco: Never Used  . Alcohol use Not on file   Current Meds  Medication Sig  . escitalopram (LEXAPRO) 20 MG tablet Take 20 mg by mouth daily.  Marland Kitchen. olmesartan (BENICAR) 20 MG tablet Take 20 mg by mouth daily.    BP (!) 129/92   Pulse 84   Wt 235 lb (106.6 kg)   Physical Exam  Constitutional: She is oriented to person, place, and time. She appears well-developed and well-nourished. No distress.  Cardiovascular: Normal rate and intact distal pulses.   Neurological: She is alert and oriented to person, place, and time. She has normal reflexes. She exhibits normal muscle tone. Coordination normal.  Skin: Skin is warm and dry. No rash noted. She is not diaphoretic. No erythema. No pallor.  Psychiatric: She has a normal mood and affect. Her behavior is normal. Judgment and thought content normal.    Ortho Exam Right  shoulder  Painful tender anterior rotator interval and biceps tendon with crepitance from the apex anterior angulated fracture. Her passive range of motion is 130 of flexion 100 abduction 50 of external rotation  The shoulder is stable to abduction external rotation and inferior subluxation test. Rotator cuff strength is 2+ with pain to manual muscle resistance. The skin is otherwise normal no erythema is noted. Axillary lymph nodes are negative she is a good radial pulse and normal sensation in the right hand  The left shoulder has full range of motion and stability is normal with excellent strength  ASSESSMENT: My personal interpretation of the images:  Hospital x-rays 3 views including axillary view show proximal humerus deformity from a previous proximal humerus fracture; apex anterior angulation is noted  Encounter Diagnosis  Name Primary?  . Rotator cuff syndrome of right shoulder Yes     PLAN  Procedure note the subacromial injection shoulder RIGHT  Verbal consent was obtained to inject the  RIGHT   Shoulder  Timeout was completed to confirm the injection site is a subacromial space of the  RIGHT  shoulder   Medication used Depo-Medrol 40 mg and lidocaine 1% 3 cc  Anesthesia was provided by ethyl chloride  The injection was performed in the RIGHT  posterior subacromial space. After pinning the skin with alcohol and anesthetized the skin with ethyl chloride the subacromial space was injected using a 20-gauge needle. There were no complications  Sterile dressing  was applied.    Fuller CanadaStanley Harrison, MD 06/29/2016 4:04 PM  .meds

## 2016-07-31 ENCOUNTER — Ambulatory Visit (INDEPENDENT_AMBULATORY_CARE_PROVIDER_SITE_OTHER): Payer: No Typology Code available for payment source | Admitting: Orthopedic Surgery

## 2016-07-31 ENCOUNTER — Encounter: Payer: Self-pay | Admitting: Orthopedic Surgery

## 2016-07-31 DIAGNOSIS — M7521 Bicipital tendinitis, right shoulder: Secondary | ICD-10-CM | POA: Diagnosis not present

## 2016-07-31 NOTE — Progress Notes (Signed)
Patient ID: Angela Wallace, female   DOB: 02/11/1961, 55 y.o.   MRN: [id number]  Chief Complaint  Patient presents with  . Follow-up    RIGHT SHOULDER    HPI Angela Wallace is a 55 y.o. female.   HPI  55 years old had a proximal humerus fracture which was treated nonoperatively several years ago presented with right shoulder pain received cortisone injection with signs of impingement presents back with initially improved but now worsening right shoulder pain. Pain is over the anterior shoulder joint at the apex of the nonunited fracture  Review of Systems Review of Systems No change in the overall range of motion  Physical Exam  inspection reveals tenderness at the apex of the fracture over the biceps tendon with crepitance on range of motion internal and external  Shoulder flexion is still upwards of 150. Rotator cuff strength is normal she'll remain stable.  Neurovascular exam no abnormalities    Physical Exam  As above  Encounter Diagnosis  Name Primary?  . Bicipital tendinitis of right shoulder Yes    Bicipital tendon injection Verbal consent was given. TO completed Sterile technique was used to inject the anterior aspect of the right shoulder over the apex of deformity of a prior malunited [place] humerus fracture which was treated closed per patient request  Ethyl chloride and alcohol was used to prep the skin and 25-gauge needle was used to inject 40 mg of Depo-Medrol and 3 mL 1% lidocaine.

## 2016-07-31 NOTE — Patient Instructions (Signed)

## 2016-09-13 ENCOUNTER — Emergency Department (HOSPITAL_COMMUNITY)
Admission: EM | Admit: 2016-09-13 | Discharge: 2016-09-13 | Disposition: A | Discharge status: Home / Self Care | Payer: No Typology Code available for payment source | Attending: Emergency Medicine | Admitting: Emergency Medicine

## 2016-09-13 ENCOUNTER — Emergency Department (HOSPITAL_COMMUNITY): Payer: No Typology Code available for payment source

## 2016-09-13 ENCOUNTER — Encounter (HOSPITAL_COMMUNITY): Payer: Self-pay | Admitting: Emergency Medicine

## 2016-09-13 DIAGNOSIS — S20212A Contusion of left front wall of thorax, initial encounter: Secondary | ICD-10-CM | POA: Insufficient documentation

## 2016-09-13 DIAGNOSIS — Y999 Unspecified external cause status: Secondary | ICD-10-CM | POA: Diagnosis not present

## 2016-09-13 DIAGNOSIS — Z79899 Other long term (current) drug therapy: Secondary | ICD-10-CM | POA: Insufficient documentation

## 2016-09-13 DIAGNOSIS — Z87891 Personal history of nicotine dependence: Secondary | ICD-10-CM | POA: Insufficient documentation

## 2016-09-13 DIAGNOSIS — Y9301 Activity, walking, marching and hiking: Secondary | ICD-10-CM | POA: Insufficient documentation

## 2016-09-13 DIAGNOSIS — S0031XA Abrasion of nose, initial encounter: Secondary | ICD-10-CM | POA: Diagnosis not present

## 2016-09-13 DIAGNOSIS — Z7982 Long term (current) use of aspirin: Secondary | ICD-10-CM | POA: Diagnosis not present

## 2016-09-13 DIAGNOSIS — W19XXXA Unspecified fall, initial encounter: Secondary | ICD-10-CM

## 2016-09-13 DIAGNOSIS — S60221A Contusion of right hand, initial encounter: Secondary | ICD-10-CM

## 2016-09-13 DIAGNOSIS — Y929 Unspecified place or not applicable: Secondary | ICD-10-CM | POA: Diagnosis not present

## 2016-09-13 DIAGNOSIS — I10 Essential (primary) hypertension: Secondary | ICD-10-CM | POA: Diagnosis not present

## 2016-09-13 DIAGNOSIS — W01198A Fall on same level from slipping, tripping and stumbling with subsequent striking against other object, initial encounter: Secondary | ICD-10-CM | POA: Insufficient documentation

## 2016-09-13 DIAGNOSIS — S299XXA Unspecified injury of thorax, initial encounter: Secondary | ICD-10-CM | POA: Diagnosis present

## 2016-09-13 HISTORY — DX: Essential (primary) hypertension: I10

## 2016-09-13 HISTORY — DX: Migraine, unspecified, not intractable, without status migrainosus: G43.909

## 2016-09-13 MED ORDER — OXYCODONE-ACETAMINOPHEN 5-325 MG PO TABS: 1.0000 | ORAL_TABLET | Freq: Three times a day (TID) | ORAL | 0 refills | Status: DC | PRN

## 2016-09-13 NOTE — Discharge Instructions (Signed)
Watch for worsening shortness of breath

## 2016-09-13 NOTE — ED Provider Notes (Signed)
[facility]-EMERGENCY DEPT Provider Note   CSN: [id number] Arrival date & time: 09/13/16  0906   By signing my name below, I, Bobbie Stack, attest that this documentation has been prepared under the direction and in the presence of Benjiman Core, MD. Electronically Signed: Bobbie Stack, Scribe. 09/13/16. 9:38 AM. History   Chief Complaint Chief Complaint  Patient presents with  . Fall    The history is provided by the patient. No language interpreter was used.   HPI Comments: Angela Wallace is a 56 y.o. female who presents to the Emergency Department complaining of right hand and left rib pain s/p falling yesterday. Patient reports that she was walking her dogs yesterday when she stumbled and fell face first onto some concrete. She states that the pain hurts worse when she moves. She denies LOC, dizziness, and blurred vision. She also reports waking up with a soar throat this morning. She denies taking anything for her soar throat. Past Medical History:  Diagnosis Date  . Hypertension   . Migraines     There are no active problems to display for this patient.   Past Surgical History:  Procedure Laterality Date  . ABDOMINAL HYSTERECTOMY    . CYST EXCISION      OB History    Gravida Para Term Preterm AB Living   3 2 2   1 2    SAB TAB Ectopic Multiple Live Births   1               Home Medications    Prior to Admission medications   Medication Sig Start Date End Date Taking? Authorizing Provider  aspirin EC 81 MG tablet Take 81 mg by mouth daily.   Yes Historical Provider, MD  butalbital-acetaminophen-caffeine (FIORICET, ESGIC) 50-325-40 MG tablet Take 1 tablet by mouth 2 (two) times daily as needed for migraine.   Yes Historical Provider, MD  CALCIUM PO Take 1 tablet by mouth daily.   Yes Historical Provider, MD  Coenzyme Q10 (COQ10) 200 MG CAPS Take 1 capsule by mouth daily.   Yes Historical Provider, MD  escitalopram (LEXAPRO) 20 MG tablet Take 20 mg by mouth  daily.   Yes Historical Provider, MD  MAGNESIUM PO Take 1 tablet by mouth daily as needed (headache).   Yes Historical Provider, MD  methocarbamol (ROBAXIN) 500 MG tablet Take 500 mg by mouth daily as needed for muscle spasms (migraine).   Yes Historical Provider, MD  olmesartan (BENICAR) 20 MG tablet Take 20 mg by mouth daily.   Yes Historical Provider, MD  Omega-3 Fatty Acids (FISH OIL PO) Take 1 capsule by mouth 2 (two) times daily.   Yes Historical Provider, MD  TURMERIC PO Take 2 tablets by mouth daily.   Yes Historical Provider, MD  oxyCODONE-acetaminophen (PERCOCET/ROXICET) 5-325 MG tablet Take 1-2 tablets by mouth every 8 (eight) hours as needed for severe pain. 09/13/16   Benjiman Core, MD    Family History Family History  Problem Relation Age of Onset  . Heart failure Mother   . Heart failure Father   . Hypertension Other     Social History Social History  Substance Use Topics  . Smoking status: Former Smoker    Years: 5.00    Types: Cigarettes  . Smokeless tobacco: Never Used  . Alcohol use Yes     Comment: occasional     Allergies   Codeine   Review of Systems Review of Systems  All other systems reviewed and are negative.  Physical Exam Updated Vital Signs BP 118/86 (BP Location: Left Arm)   Pulse 83   Temp 97.8 F (36.6 C) (Oral)   Resp 16   Ht 5' 11.5" (1.816 m)   Wt 230 lb (104.3 kg)   SpO2 98%   BMI 31.63 kg/m   Physical Exam  Constitutional: She is oriented to person, place, and time. She appears well-developed and well-nourished. No distress.  Eyes: Conjunctivae and EOM are normal. Pupils are equal, round, and reactive to light.  Neck: Normal range of motion. Neck supple.  Cardiovascular: Normal rate and regular rhythm.  Exam reveals no gallop and no friction rub.   No murmur heard. Pulmonary/Chest: Effort normal and breath sounds normal. She has no wheezes. She has no rales.  Abdominal: Soft. Bowel sounds are normal. There is no  tenderness.  Musculoskeletal: She exhibits tenderness.  Tenderness and swelling to the right hand. Worse on 4th and 5th metacarpals. Tenderness along left lateral chest.  Neurological: She is alert and oriented to person, place, and time. No cranial nerve deficit.  Skin: Skin is warm and dry.  Abrasion along medial 5th mcp joint and distal phalanx joint. Abrasion along the nose and chin. No septal hematoma.  Psychiatric: She has a normal mood and affect.     ED Treatments / Results  DIAGNOSTIC STUDIES: Oxygen Saturation is 98% on RA, normal by my interpretation.    COORDINATION OF CARE: 9:30 AM Discussed treatment plan with pt at bedside and pt agreed to plan.  Labs (all labs ordered are listed, but only abnormal results are displayed) Labs Reviewed - No data to display  EKG  EKG Interpretation None       Radiology Dg Ribs Unilateral W/chest Left  Result Date: 09/13/2016 CLINICAL DATA:  Chest pain following a fall. EXAM: LEFT RIBS AND CHEST - 3+ VIEW COMPARISON:  None. FINDINGS: Normal sized heart. Mildly prominent interstitial markings. Linear densities at the right lung base. No rib fracture or pneumothorax. Old, healed proximal right humerus fracture. Diffuse osteopenia. IMPRESSION: 1. No rib fracture seen. 2. Mild right basilar atelectasis or scarring. 3. Mild chronic interstitial lung disease. Electronically Signed   By: Beckie SaltsSteven  Reid M.D.   On: 09/13/2016 10:37   Dg Hand Complete Right  Result Date: 09/13/2016 CLINICAL DATA:  Right hand pain following fall. EXAM: RIGHT HAND - COMPLETE 3+ VIEW COMPARISON:  None. FINDINGS: Moderate third DIP joint degenerative spur formation. Triangular-shaped calcific density between the bases of the first and second metacarpals, adjacent to the base of the second metacarpal at the articulation of the second metacarpal and carpals. Otherwise, no fracture or dislocation seen. Mild diffuse soft tissue swelling. IMPRESSION: 1. Probable  degenerative hypertrophy at the articulation of the second metacarpal carpals. A fracture at that location cannot be excluded. Correlation with the presence or absence of point tenderness at that location is necessary. 2. Otherwise, no fracture seen. 3. Third DIP joint degenerative changes. Electronically Signed   By: Beckie SaltsSteven  Reid M.D.   On: 09/13/2016 10:27    Procedures Procedures (including critical care time)  Medications Ordered in ED Medications - No data to display   Initial Impression / Assessment and Plan / ED Course  I have reviewed the triage vital signs and the nursing notes.  Pertinent labs & imaging results that were available during my care of the patient were reviewed by me and considered in my medical decision making (see chart for details).  Clinical Course     Patient with  fall. Hand contusion and likely rib contusion or fractures with negative x-ray. No pneumo. Patient is worried about her flight think it is reasonable to fly on Wednesday. Will discharge home with pain medicine.  Final Clinical Impressions(s) / ED Diagnoses   Final diagnoses:  Fall, initial encounter  Contusion of right hand, initial encounter  Contusion of left chest wall, initial encounter    New Prescriptions New Prescriptions   OXYCODONE-ACETAMINOPHEN (PERCOCET/ROXICET) 5-325 MG TABLET    Take 1-2 tablets by mouth every 8 (eight) hours as needed for severe pain.   I personally performed the services described in this documentation, which was scribed in my presence. The recorded information has been reviewed and is accurate.      Benjiman Core, MD 09/13/16 1052

## 2016-09-13 NOTE — ED Triage Notes (Signed)
Patient c/o right hand and left rib pain after falling face first onto concrete. Patient states tripped "over her feet." Patient has abrasion and bruising to nose, states she caught herself before head hit concrete. Denies LOC, dizziness, or blurred vision. Right hand swollen. Per patient painful to take deep breath or move on left side.

## 2019-05-26 ENCOUNTER — Other Ambulatory Visit: Payer: Self-pay

## 2019-05-26 ENCOUNTER — Emergency Department (HOSPITAL_BASED_OUTPATIENT_CLINIC_OR_DEPARTMENT_OTHER)
Admission: EM | Admit: 2019-05-26 | Discharge: 2019-05-26 | Disposition: A | Discharge status: Home / Self Care | Payer: No Typology Code available for payment source | Source: Home / Self Care | Attending: Emergency Medicine | Admitting: Emergency Medicine

## 2019-05-26 ENCOUNTER — Emergency Department (HOSPITAL_BASED_OUTPATIENT_CLINIC_OR_DEPARTMENT_OTHER): Payer: No Typology Code available for payment source

## 2019-05-26 ENCOUNTER — Encounter (HOSPITAL_BASED_OUTPATIENT_CLINIC_OR_DEPARTMENT_OTHER): Payer: Self-pay | Admitting: Emergency Medicine

## 2019-05-26 ENCOUNTER — Encounter (HOSPITAL_COMMUNITY): Payer: Self-pay | Admitting: Emergency Medicine

## 2019-05-26 ENCOUNTER — Emergency Department (HOSPITAL_COMMUNITY): Payer: No Typology Code available for payment source

## 2019-05-26 ENCOUNTER — Observation Stay (HOSPITAL_COMMUNITY)
Admission: EM | Admit: 2019-05-26 | Discharge: 2019-05-27 | Disposition: A | Discharge status: Home / Self Care | Payer: No Typology Code available for payment source | Attending: Internal Medicine | Admitting: Internal Medicine

## 2019-05-26 DIAGNOSIS — R200 Anesthesia of skin: Secondary | ICD-10-CM

## 2019-05-26 DIAGNOSIS — I447 Left bundle-branch block, unspecified: Secondary | ICD-10-CM

## 2019-05-26 DIAGNOSIS — Z7902 Long term (current) use of antithrombotics/antiplatelets: Secondary | ICD-10-CM | POA: Insufficient documentation

## 2019-05-26 DIAGNOSIS — I10 Essential (primary) hypertension: Secondary | ICD-10-CM

## 2019-05-26 DIAGNOSIS — Z7982 Long term (current) use of aspirin: Secondary | ICD-10-CM | POA: Insufficient documentation

## 2019-05-26 DIAGNOSIS — Z79899 Other long term (current) drug therapy: Secondary | ICD-10-CM | POA: Diagnosis not present

## 2019-05-26 DIAGNOSIS — M6281 Muscle weakness (generalized): Secondary | ICD-10-CM | POA: Diagnosis not present

## 2019-05-26 DIAGNOSIS — E876 Hypokalemia: Secondary | ICD-10-CM | POA: Diagnosis present

## 2019-05-26 DIAGNOSIS — F32A Depression, unspecified: Secondary | ICD-10-CM

## 2019-05-26 DIAGNOSIS — R51 Headache: Secondary | ICD-10-CM

## 2019-05-26 DIAGNOSIS — G43109 Migraine with aura, not intractable, without status migrainosus: Secondary | ICD-10-CM | POA: Diagnosis not present

## 2019-05-26 DIAGNOSIS — Z87891 Personal history of nicotine dependence: Secondary | ICD-10-CM | POA: Insufficient documentation

## 2019-05-26 DIAGNOSIS — Z20828 Contact with and (suspected) exposure to other viral communicable diseases: Secondary | ICD-10-CM | POA: Diagnosis not present

## 2019-05-26 DIAGNOSIS — R202 Paresthesia of skin: Secondary | ICD-10-CM | POA: Diagnosis not present

## 2019-05-26 DIAGNOSIS — F329 Major depressive disorder, single episode, unspecified: Secondary | ICD-10-CM | POA: Diagnosis not present

## 2019-05-26 DIAGNOSIS — E785 Hyperlipidemia, unspecified: Secondary | ICD-10-CM | POA: Insufficient documentation

## 2019-05-26 DIAGNOSIS — I639 Cerebral infarction, unspecified: Secondary | ICD-10-CM | POA: Diagnosis not present

## 2019-05-26 DIAGNOSIS — Z885 Allergy status to narcotic agent status: Secondary | ICD-10-CM | POA: Diagnosis not present

## 2019-05-26 LAB — CBC
HCT: 38.3 % (ref 36.0–46.0)
HCT: 40 % (ref 36.0–46.0)
Hemoglobin: 12.5 g/dL (ref 12.0–15.0)
Hemoglobin: 13.3 g/dL (ref 12.0–15.0)
MCH: 29.2 pg (ref 26.0–34.0)
MCH: 29.8 pg (ref 26.0–34.0)
MCHC: 32.6 g/dL (ref 30.0–36.0)
MCHC: 33.3 g/dL (ref 30.0–36.0)
MCV: 89.5 fL (ref 80.0–100.0)
MCV: 89.7 fL (ref 80.0–100.0)
Platelets: 250 10*3/uL (ref 150–400)
Platelets: 254 10*3/uL (ref 150–400)
RBC: 4.28 MIL/uL (ref 3.87–5.11)
RBC: 4.46 MIL/uL (ref 3.87–5.11)
RDW: 13.2 % (ref 11.5–15.5)
RDW: 13.2 % (ref 11.5–15.5)
WBC: 7.9 10*3/uL (ref 4.0–10.5)
WBC: 8.3 10*3/uL (ref 4.0–10.5)
nRBC: 0 % (ref 0.0–0.2)
nRBC: 0 % (ref 0.0–0.2)

## 2019-05-26 LAB — I-STAT CHEM 8, ED
BUN: 27 mg/dL — ABNORMAL HIGH (ref 6–20)
Calcium, Ion: 1.19 mmol/L (ref 1.15–1.40)
Chloride: 103 mmol/L (ref 98–111)
Creatinine, Ser: 1.1 mg/dL — ABNORMAL HIGH (ref 0.44–1.00)
Glucose, Bld: 135 mg/dL — ABNORMAL HIGH (ref 70–99)
HCT: 40 % (ref 36.0–46.0)
Hemoglobin: 13.6 g/dL (ref 12.0–15.0)
Potassium: 3 mmol/L — ABNORMAL LOW (ref 3.5–5.1)
Sodium: 141 mmol/L (ref 135–145)
TCO2: 26 mmol/L (ref 22–32)

## 2019-05-26 LAB — COMPREHENSIVE METABOLIC PANEL
ALT: 21 U/L (ref 0–44)
ALT: 22 U/L (ref 0–44)
AST: 19 U/L (ref 15–41)
AST: 21 U/L (ref 15–41)
Albumin: 4.3 g/dL (ref 3.5–5.0)
Albumin: 4.2 g/dL (ref 3.5–5.0)
Alkaline Phosphatase: 73 U/L (ref 38–126)
Alkaline Phosphatase: 71 U/L (ref 38–126)
Anion gap: 12 (ref 5–15)
Anion gap: 14 (ref 5–15)
BUN: 31 mg/dL — ABNORMAL HIGH (ref 6–20)
BUN: 26 mg/dL — ABNORMAL HIGH (ref 6–20)
CO2: 24 mmol/L (ref 22–32)
CO2: 27 mmol/L (ref 22–32)
Calcium: 9.4 mg/dL (ref 8.9–10.3)
Calcium: 9.6 mg/dL (ref 8.9–10.3)
Chloride: 102 mmol/L (ref 98–111)
Chloride: 99 mmol/L (ref 98–111)
Creatinine, Ser: 1.05 mg/dL — ABNORMAL HIGH (ref 0.44–1.00)
Creatinine, Ser: 1.18 mg/dL — ABNORMAL HIGH (ref 0.44–1.00)
GFR calc Af Amer: >60 mL/min (ref >60)
GFR calc Af Amer: 58 mL/min — ABNORMAL LOW (ref >60)
GFR calc non Af Amer: 58 mL/min — ABNORMAL LOW (ref >60)
GFR calc non Af Amer: 50 mL/min — ABNORMAL LOW (ref >60)
Glucose, Bld: 115 mg/dL — ABNORMAL HIGH (ref 70–99)
Glucose, Bld: 145 mg/dL — ABNORMAL HIGH (ref 70–99)
Potassium: 3.3 mmol/L — ABNORMAL LOW (ref 3.5–5.1)
Potassium: 3.1 mmol/L — ABNORMAL LOW (ref 3.5–5.1)
Sodium: 138 mmol/L (ref 135–145)
Sodium: 140 mmol/L (ref 135–145)
Total Bilirubin: 0.3 mg/dL (ref 0.3–1.2)
Total Bilirubin: 0.2 mg/dL — ABNORMAL LOW (ref 0.3–1.2)
Total Protein: 7.1 g/dL (ref 6.5–8.1)
Total Protein: 6.8 g/dL (ref 6.5–8.1)

## 2019-05-26 LAB — I-STAT BETA HCG BLOOD, ED (MC, WL, AP ONLY): I-stat hCG, quantitative: <5 m[IU]/mL (ref <5)

## 2019-05-26 LAB — DIFFERENTIAL
Abs Immature Granulocytes: 0.02 10*3/uL (ref 0.00–0.07)
Basophils Absolute: 0 10*3/uL (ref 0.0–0.1)
Basophils Relative: 0 %
Eosinophils Absolute: 0.1 10*3/uL (ref 0.0–0.5)
Eosinophils Relative: 1 %
Immature Granulocytes: 0 %
Lymphocytes Relative: 45 %
Lymphs Abs: 3.8 10*3/uL (ref 0.7–4.0)
Monocytes Absolute: 0.4 10*3/uL (ref 0.1–1.0)
Monocytes Relative: 5 %
Neutro Abs: 4 10*3/uL (ref 1.7–7.7)
Neutrophils Relative %: 49 %

## 2019-05-26 LAB — RAPID URINE DRUG SCREEN, HOSP PERFORMED
Amphetamines: NOT DETECTED
Barbiturates: NOT DETECTED
Benzodiazepines: NOT DETECTED
Cocaine: NOT DETECTED
Opiates: NOT DETECTED
Tetrahydrocannabinol: NOT DETECTED

## 2019-05-26 LAB — APTT: aPTT: 37 seconds — ABNORMAL HIGH (ref 24–36)

## 2019-05-26 LAB — TROPONIN I (HIGH SENSITIVITY): Troponin I (High Sensitivity): 6 ng/L (ref <18)

## 2019-05-26 LAB — PROTIME-INR
INR: 1 (ref 0.8–1.2)
Prothrombin Time: 12.8 seconds (ref 11.4–15.2)

## 2019-05-26 MED ORDER — POTASSIUM CHLORIDE CRYS ER 20 MEQ PO TBCR
40.0000 meq | EXTENDED_RELEASE_TABLET | Freq: Once | ORAL | Status: AC
  Administered 2019-05-26: 23:00:00 40 meq via ORAL
  Filled 2019-05-26: qty 2

## 2019-05-26 MED ORDER — SODIUM CHLORIDE 0.9 % IV BOLUS
500.0000 mL | Freq: Once | INTRAVENOUS | Status: AC
  Administered 2019-05-26: 500 mL via INTRAVENOUS

## 2019-05-26 MED ORDER — DEXAMETHASONE SODIUM PHOSPHATE 10 MG/ML IJ SOLN
10.0000 mg | Freq: Once | INTRAMUSCULAR | Status: AC
  Administered 2019-05-26: 21:00:00 10 mg via INTRAVENOUS
  Filled 2019-05-26: qty 1

## 2019-05-26 MED ORDER — METOCLOPRAMIDE HCL 5 MG/ML IJ SOLN
10.0000 mg | Freq: Once | INTRAMUSCULAR | Status: AC
  Administered 2019-05-26: 10 mg via INTRAVENOUS
  Filled 2019-05-26: qty 2

## 2019-05-26 MED ORDER — SODIUM CHLORIDE 0.9% FLUSH
3.0000 mL | Freq: Once | INTRAVENOUS | Status: DC
Start: 2019-05-26 — End: 2019-05-27

## 2019-05-26 MED ORDER — DIPHENHYDRAMINE HCL 50 MG/ML IJ SOLN
25.0000 mg | Freq: Once | INTRAMUSCULAR | Status: AC
  Administered 2019-05-26: 21:00:00 25 mg via INTRAVENOUS
  Filled 2019-05-26: qty 1

## 2019-05-26 NOTE — Discharge Instructions (Signed)
Your lab work today was reassuring except for a slightly low potassium.  On your EKG you did have a left bundle branch block but it is hard to say how long you have had that.  It will be important for you to follow-up with cardiology in the future for stress test and evaluation.  Also you need to follow-up with your neurologist or regular doctor this week for a TIA work-up including an MRI and Dopplers of the neck and the heart.  If any of the symptoms return at any point in time you should return to the emergency room immediately for more evaluation.

## 2019-05-26 NOTE — ED Provider Notes (Signed)
[facility]ENC[facility]PARTMENT Provider Note   CSN: [id number] Arrival date & time: 05/26/19  1456     History   Chief Complaint Chief Complaint  Patient presents with   Numbness    HPI LOLETTA HARPER is a 58 y.o. female.     Patient is a very pleasant 58 year old female with a history of hypertension and migraines who is presenting today with a complaint of left-sided numbness.  Patient states around 1130 today she noticed that her left upper lip felt numb and then progressed to her left thumb and her left thigh.  The symptoms lasted a total of a few hours and then resolved after she took some aspirin.  Symptoms completely resolved before 3:00.  She currently has no complaints at this time.  She has not had a headache today she denies any chest pain, shortness of breath, flulike illness.  She has had no recent medication changes.  She otherwise feels in her normal state of health at this time.  She denies alcohol drug or tobacco use.  She has no prior history of stroke.  She did talk with her doctor today and they recommended she come for evaluation.  The history is provided by the patient.    Past Medical History:  Diagnosis Date   Hypertension    Migraines     There are no active problems to display for this patient.   Past Surgical History:  Procedure Laterality Date   ABDOMINAL HYSTERECTOMY     CYST EXCISION       OB History    Gravida  3   Para  2   Term  2   Preterm      AB  1   Living  2     SAB  1   TAB      Ectopic      Multiple      Live Births               Home Medications    Prior to Admission medications   Medication Sig Start Date End Date Taking? Authorizing Provider  BACLOFEN PO Take by mouth.   Yes [provider]  aspirin EC 81 MG tablet Take 81 mg by mouth daily.    [provider]  butalbital-acetaminophen-caffeine (FIORICET, ESGIC) 50-325-40 MG tablet Take 1 tablet by mouth 2 (two)  times daily as needed for migraine.    [provider]  CALCIUM PO Take 1 tablet by mouth daily.    [provider]  Coenzyme Q10 (COQ10) 200 MG CAPS Take 1 capsule by mouth daily.    [provider]  escitalopram (LEXAPRO) 20 MG tablet Take 20 mg by mouth daily.    [provider]  MAGNESIUM PO Take 1 tablet by mouth daily as needed (headache).    [provider]  methocarbamol (ROBAXIN) 500 MG tablet Take 500 mg by mouth daily as needed for muscle spasms (migraine).    [provider]  olmesartan (BENICAR) 20 MG tablet Take 20 mg by mouth daily.    [provider]  Omega-3 Fatty Acids (FISH OIL PO) Take 1 capsule by mouth 2 (two) times daily.    [provider]  oxyCODONE-acetaminophen (PERCOCET/ROXICET) 5-325 MG tablet Take 1-2 tablets by mouth every 8 (eight) hours as needed for severe pain. 09/13/16   Davonna Belling, MD  TURMERIC PO Take 2 tablets by mouth daily.    [provider]  Family History Family History  Problem Relation Age of Onset   Heart failure Mother    Heart failure Father    Hypertension Other     Social History Social History   Tobacco Use   Smoking status: Former Smoker    Years: 5.00    Types: Cigarettes   Smokeless tobacco: Never Used  Substance Use Topics   Alcohol use: Yes    Comment: occasional   Drug use: No     Allergies   Codeine   Review of Systems Review of Systems  All other systems reviewed and are negative.    Physical Exam Updated Vital Signs BP (!) 152/98    Pulse 84    Temp 98.2 F (36.8 C) (Oral)    Resp 19    Ht 6' (1.829 m)    Wt 100.7 kg    SpO2 98%    BMI 30.11 kg/m   Physical Exam Vitals signs and nursing note reviewed.  Constitutional:      General: She is not in acute distress.    Appearance: She is well-developed.  HENT:     Head: Normocephalic and atraumatic.  Eyes:     General: No visual field deficit.     Conjunctiva/sclera: Conjunctivae normal.     Pupils: Pupils are equal, round, and reactive to light.  Neck:     Musculoskeletal: Normal range of motion and neck supple.  Cardiovascular:     Rate and Rhythm: Normal rate and regular rhythm.     Heart sounds: No murmur.  Pulmonary:     Effort: Pulmonary effort is normal. No respiratory distress.     Breath sounds: Normal breath sounds. No wheezing or rales.  Abdominal:     General: There is no distension.     Palpations: Abdomen is soft.     Tenderness: There is no abdominal tenderness. There is no guarding or rebound.  Musculoskeletal: Normal range of motion.        General: No tenderness.  Skin:    General: Skin is warm and dry.     Findings: No erythema or rash.  Neurological:     General: No focal deficit present.     Mental Status: She is alert and oriented to person, place, and time. Mental status is at baseline.     Cranial Nerves: Cranial nerves are intact. No cranial nerve deficit, dysarthria or facial asymmetry.     Sensory: No sensory deficit.     Motor: No weakness.     Coordination: Coordination is intact. Romberg sign negative. Coordination normal.     Gait: Gait is intact. Gait normal.  Psychiatric:        Behavior: Behavior normal.      ED Treatments / Results  Labs (all labs ordered are listed, but only abnormal results are displayed) Labs Reviewed  COMPREHENSIVE METABOLIC PANEL - Abnormal; Notable for the following components:      Result Value   Potassium 3.3 (*)    Glucose, Bld 115 (*)    BUN 31 (*)    Creatinine, Ser 1.05 (*)    GFR calc non Af Amer 58 (*)    All other components within normal limits  RAPID URINE DRUG SCREEN, HOSP PERFORMED  CBC  TROPONIN I (HIGH SENSITIVITY)  TROPONIN I (HIGH SENSITIVITY)    EKG EKG Interpretation  Date/Time:  Friday May 26 2019 15:07:42 EDT Ventricular Rate:  79 PR Interval:    QRS Duration: 157 QT Interval:  424 QTC  Calculation: 487 R  Axis:   36 Text Interpretation:  Sinus rhythm new Left bundle branch block Confirmed by Gwyneth Sprout ([id number]) on 05/26/2019 3:43:05 PM   Radiology Ct Head Wo Contrast  Result Date: 05/26/2019 CLINICAL DATA:  Left facial and upper extremity numbness. EXAM: CT HEAD WITHOUT CONTRAST TECHNIQUE: Contiguous axial images were obtained from the base of the skull through the vertex without intravenous contrast. COMPARISON:  None. FINDINGS: Brain: No evidence of acute infarction, hemorrhage, hydrocephalus, extra-axial collection or mass lesion/mass effect. Vascular: No hyperdense vessel or unexpected calcification. Skull: Normal. Negative for fracture or focal lesion. Sinuses/Orbits: No acute finding. Other: None. IMPRESSION: Normal head CT. Electronically Signed   By: Lupita Raider M.D.   On: 05/26/2019 16:26    Procedures Procedures (including critical care time)  Medications Ordered in ED Medications - No data to display   Initial Impression / Assessment and Plan / ED Course  I have reviewed the triage vital signs and the nursing notes.  Pertinent labs & imaging results that were available during my care of the patient were reviewed by me and considered in my medical decision making (see chart for details).        58 year old female presenting today with symptoms concerning for TIA with left-sided numbness but no weakness or facial droop noted.  Patient was with a friend who was with her the entire time and she never noticed facial drooping either.  Patient has had no visual changes or speech issues.  She is currently asymptomatic at this time.  She also did not have any chest pain, shortness of breath or radiation of symptoms.  Patient's exam here is within normal limits.  Blood pressure has been elevated in the 150s over 90s.  Patient's EKG today shows a left bundle branch block however last EKG in our system was from 18 years ago.  Is unclear if this is new.  Patient states she does suffer  from a lot of stress and anxiety and frequently has chest pains but always chalks it up to being anxious.  She was planning on following up with her doctor for more thorough exam in the future.  Will initiate TIA protocol.  However then will have to have a discussion with the patient and her doctor whether she can have outpatient work-up versus transferring to [facility] for MRI today.  6:06 PM Patient's symptoms remain gone.  Labs with some mild hypokalemia of 3.3 but otherwise normal CBC, UDS and troponin.  Patient CT without acute findings.  Discussed with the patient the option of going to [facility] for an MRI for further evaluation versus following up with her PCP and neurologist the first thing of the week for a TIA work-up.  Patient does not desire to go to [facility] and would prefer to follow-up with her doctors as an outpatient for a TIA work-up.  Repeat blood pressure is 120/85 and patient is well-appearing.  Did caution that if she develops any return of symptoms at all she needs to return immediately to the [facility] emergency room for an MRI.  Patient understands and would like to go home.  She will also follow-up with her PCP in regards to her abnormal EKG and was planning on seeing a cardiologist in the future for a work-up as well.  Final Clinical Impressions(s) / ED Diagnoses   Final diagnoses:  Numbness  Left bundle branch block    ED Discharge Orders    None  Gwyneth Sprout, MD 05/26/19 1807

## 2019-05-26 NOTE — H&P (Signed)
History and Physical    Angela KailSandra W Sautter ZOX:[id number] DOB: 05/26/1961 DOA: 05/26/2019  PCP: Elfredia NevinsFusco, Lawrence, MD Patient coming from: Home  Chief Complaint: Left-sided numbness  HPI: Angela Wallace is a 58 y.o. female with medical history significant of hypertension, migraines presenting to the ED with complaints of left-sided numbness.  Patient states yesterday 9/25 around 12:30 PM in the afternoon she experienced acute onset numbness and tingling of her left lower lip, left hand and forearm, left thigh, and left foot.  She went to the ED by 2:30 PM and symptoms had already resolved by then.  She was told this could be TIA and was discharged home.  Soon after getting back to her house she experienced the symptoms again.  States currently her symptoms are persistent but slightly improved.  Denies any focal weakness or difficulty with speech.  Denies blurry vision.  States she has a history of migraines for which she is seen by outpatient neurology.  She did experience a left-sided headache yesterday which resolved after she received medications in the ED.  Denies history of prior stroke.  States she was smoking cigarettes socially several years ago and has not smoked in the past 8 years.  Patient was seen and evaluated at [facility] for similar symptoms earlier this morning which had completely resolved.  She was discharged home with plan for outpatient follow-up.  ED Course: Hemodynamically stable.  Potassium 3.1.  BUN 26, creatinine 1.1.  High-sensitivity troponin negative.  UDS negative.  INR 1.0.  Head CT negative for acute finding.  Brain MRI with question of a tiny acute infarction along the lateral right thalamus. Patient was seen by neurology and her symptoms were thought to be more likely related to complex migraine than stroke.  Her deficits were very mild and hence TPA was not given.  Neurology recommended brain MRI. Patient received IV dexamethasone 10 mg, IV Benadryl 25 mg, IV  metoclopramide 10 mg, oral potassium 40 mg, and a 500 cc normal saline bolus.  Review of Systems:  All systems reviewed and apart from history of presenting illness, are negative.  Past Medical History:  Diagnosis Date  . Hypertension   . Migraines     Past Surgical History:  Procedure Laterality Date  . ABDOMINAL HYSTERECTOMY    . CYST EXCISION       reports that she has quit smoking. Her smoking use included cigarettes. She quit after 5.00 years of use. She has never used smokeless tobacco. She reports current alcohol use. She reports that she does not use drugs.  Allergies  Allergen Reactions  . Chocolate Other (See Comments)    Consuming a little more than usual can trigger a migraine  . Citrus Other (See Comments)    Consuming a little more than usual can trigger a migraine  . Codeine Other (See Comments)    Headache  . Nitrates, Organic Other (See Comments)    Consuming a little more than usual can trigger a migraine  . Peanut-Containing Drug Products Other (See Comments)    Consuming a little more than usual can trigger a migraine  . Pineapple Other (See Comments)    Consuming a little more than usual can trigger a migraine    Family History  Problem Relation Age of Onset  . Heart failure Mother   . Heart failure Father   . Hypertension Other     Prior to Admission medications   Medication Sig Start Date End Date Taking? Authorizing Provider  aspirin EC 81 MG tablet Take 81 mg by mouth daily.   Yes [provider]  BACLOFEN PO Take 1 tablet by mouth 3 (three) times daily as needed (for migraine-related tension).    Yes [provider]  CALCIUM PO Take 1 tablet by mouth daily.   Yes [provider]  Coenzyme Q10 (COQ10) 200 MG CAPS Take 200 mg by mouth daily.    Yes [provider]  escitalopram (LEXAPRO) 20 MG tablet Take 20 mg by mouth daily.   Yes [provider]  GLUCOSAMINE-CHONDROITIN PO Take 1 tablet by mouth 2  (two) times daily.   Yes [provider]  HYDROCHLOROTHIAZIDE PO Take 1 tablet by mouth daily.   Yes [provider]  Losartan Potassium (COZAAR PO) Take 1 tablet by mouth daily.   Yes [provider]  MAGNESIUM PO Take 1 tablet by mouth daily.    Yes [provider]  Omega-3 Fatty Acids (FISH OIL PO) Take 1 capsule by mouth 2 (two) times daily.   Yes [provider]  TURMERIC PO Take 1 capsule by mouth daily.    Yes [provider]  butalbital-acetaminophen-caffeine (FIORICET, ESGIC) 50-325-40 MG tablet Take 1 tablet by mouth 2 (two) times daily as needed for migraine.    [provider]  methocarbamol (ROBAXIN) 500 MG tablet Take 500 mg by mouth daily as needed for muscle spasms (migraine).    [provider]  oxyCODONE-acetaminophen (PERCOCET/ROXICET) 5-325 MG tablet Take 1-2 tablets by mouth every 8 (eight) hours as needed for severe pain. Patient not taking: Reported on 05/26/2019 09/13/16   Benjiman Core, MD    Physical Exam: Vitals:   05/27/19 0330 05/27/19 0400 05/27/19 0415 05/27/19 0430  BP: 119/68 107/63  127/67  Pulse: 71  72 70  Resp: (!) Temp:      TempSrc:      SpO2: 92%  90% 97%  Weight:      Height:        Physical Exam  Constitutional: She is oriented to person, place, and time. She appears well-developed and well-nourished. No distress.  HENT:  Head: Normocephalic.  Eyes: Pupils are equal, round, and reactive to light. EOM are normal.  Neck: Neck supple.  Cardiovascular: Normal rate, regular rhythm and intact distal pulses.  Pulmonary/Chest: Breath sounds normal. No respiratory distress. She has no wheezes. She has no rales.  Abdominal: Soft. Bowel sounds are normal. She exhibits no distension. There is no abdominal tenderness. There is no guarding.  Musculoskeletal:        General: No edema.  Neurological: She is alert and oriented to person, place, and time. No cranial nerve  deficit.  Speech fluent, tongue midline, no facial droop Strength 5 out of 5 in bilateral upper and lower extremities. Sensation to light touch intact throughout.  Skin: Skin is warm and dry. She is not diaphoretic.     Labs on Admission: I have personally reviewed following labs and imaging studies  CBC: Recent Labs  Lab 05/26/19 1521 05/26/19 2035 05/26/19 2037  WBC 7.9 8.3  --   NEUTROABS  --  4.0  --   HGB 12.5 13.3 13.6  HCT 38.3 40.0 40.0  MCV 89.5 89.7  --   PLT 250 254  --    Basic Metabolic Panel: Recent Labs  Lab 05/26/19 1521 05/26/19 2035 05/26/19 2037  NA 138 140 141  K 3.3* 3.1* 3.0*  CL 102 99 103  CO2 24 27  --   GLUCOSE 115* 145* 135*  BUN 31* 26* 27*  CREATININE 1.05* 1.18* 1.10*  CALCIUM 9.4 9.6  --    GFR: Estimated Creatinine Clearance: 72.2 mL/min (A) (by C-G formula based on SCr of 1.1 mg/dL (H)). Liver Function Tests: Recent Labs  Lab 05/26/19 1521 05/26/19 2035  AST 19 21  ALT 21 22  ALKPHOS 73 71  BILITOT 0.3 0.2*  PROT 7.1 6.8  ALBUMIN 4.3 4.2   No results for input(s): LIPASE, AMYLASE in the last 168 hours. No results for input(s): AMMONIA in the last 168 hours. Coagulation Profile: Recent Labs  Lab 05/26/19 2035  INR 1.0   Cardiac Enzymes: No results for input(s): CKTOTAL, CKMB, CKMBINDEX, TROPONINI in the last 168 hours. BNP (last 3 results) No results for input(s): PROBNP in the last 8760 hours. HbA1C: No results for input(s): HGBA1C in the last 72 hours. CBG: No results for input(s): GLUCAP in the last 168 hours. Lipid Profile: No results for input(s): CHOL, HDL, LDLCALC, TRIG, CHOLHDL, LDLDIRECT in the last 72 hours. Thyroid Function Tests: No results for input(s): TSH, T4TOTAL, FREET4, T3FREE, THYROIDAB in the last 72 hours. Anemia Panel: No results for input(s): VITAMINB12, FOLATE, FERRITIN, TIBC, IRON, RETICCTPCT in the last 72 hours. Urine analysis: No results found for: COLORURINE, APPEARANCEUR, LABSPEC,  PHURINE, GLUCOSEU, HGBUR, BILIRUBINUR, KETONESUR, PROTEINUR, UROBILINOGEN, NITRITE, LEUKOCYTESUR  Radiological Exams on Admission: Ct Head Wo Contrast  Result Date: 05/26/2019 CLINICAL DATA:  Left facial and upper extremity numbness. EXAM: CT HEAD WITHOUT CONTRAST TECHNIQUE: Contiguous axial images were obtained from the base of the skull through the vertex without intravenous contrast. COMPARISON:  None. FINDINGS: Brain: No evidence of acute infarction, hemorrhage, hydrocephalus, extra-axial collection or mass lesion/mass effect. Vascular: No hyperdense vessel or unexpected calcification. Skull: Normal. Negative for fracture or focal lesion. Sinuses/Orbits: No acute finding. Other: None. IMPRESSION: Normal head CT. Electronically Signed   By: Lupita Raider M.D.   On: 05/26/2019 16:26   Mr Brain Wo Contrast  Result Date: 05/26/2019 CLINICAL DATA:  Hypertension and headache.  Left-sided numbness. EXAM: MRI HEAD WITHOUT CONTRAST TECHNIQUE: Multiplanar, multiecho pulse sequences of the brain and surrounding structures were obtained without intravenous contrast. COMPARISON:  Head CT same day FINDINGS: Brain: Diffusion imaging suggests a very tiny acute infarction along the lateral right thalamus. No other possible acute finding. Minimal small vessel change affects the pons. No focal cerebellar insult. Minimal small vessel changes seen within the hemispheric white matter. No cortical or large vessel territory infarction. No mass lesion, hemorrhage, hydrocephalus or extra-axial collection. Vascular: Major vessels at the base of the brain show flow. Skull and upper cervical spine: Negative Sinuses/Orbits: Clear/normal Other: None IMPRESSION: No definite acute finding. Question of a tiny acute infarction along the lateral right thalamus. Minimal small vessel change of the pons and hemispheric white matter. Electronically Signed   By: Paulina Fusi M.D.   On: 05/26/2019 21:52    EKG: Independently reviewed.   Sinus rhythm, T wave inversions in inferior leads and left bundle branch block.  No prior EKG for comparison.  Assessment/Plan Principal Problem:   Acute CVA (cerebrovascular accident) (HCC) Active Problems:   Hypokalemia   LBBB (left bundle branch block)   HTN (hypertension)   Depression   Concern for acute CVA Presenting with complaints of numbness and tingling of left lower lip, left hand and forearm, left thigh, and left foot.  Seen by neurology and TPA was not given due to  mild deficits. Brain MRI with question of a tiny acute infarction along the lateral right thalamus.  Neurology initially felt her presentation was secondary to a complicated migraine but after MRI results recommended stroke work-up. -Telemetry monitoring -Allow for permissive hypertension-treat only if systolic blood pressures are greater than 220. -Carotid Dopplers -2D echocardiogram -Hemoglobin A1c, fasting lipid panel -Aspirin 325 p.o. now and daily -Atorvastatin 80 mg now and daily -Frequent neurochecks -PT, OT, speech therapy. -N.p.o. until cleared by bedside swallow evaluation or formal speech evaluation  Mild hypokalemia Potassium 3.1. -Replete potassium.  Check magnesium level and replete if low.  Continue to monitor electrolytes.  LBBB EKG with T wave inversions in inferior leads and new left bundle branch block.  No prior EKG for comparison.  Patient denies anginal symptoms and appears comfortable on exam.  High-sensitivity troponin reassuring. -Cardiac monitoring -Echocardiogram  Hypertension -Allow permissive hypertension at this time given concern for acute CVA  Depression -Continue home Lexapro  DVT prophylaxis: Lovenox Code Status: Full code Family Communication: No family available. Disposition Plan: Anticipate discharge in 1 to 2 days. Consults called: Neurology (Dr. Rory Percy) Admission status: It is my clinical opinion that referral for OBSERVATION is reasonable and necessary in this  patient based on the above information provided. The aforementioned taken together are felt to place the patient at high risk for further clinical deterioration. However it is anticipated that the patient may be medically stable for discharge from the hospital within 24 to 48 hours.  The medical decision making on this patient was of high complexity and the patient is at high risk for clinical deterioration, therefore this is a level 3 visit.  Shela Leff MD [facility] Pager [phone]  If 7PM-7AM, please contact night-coverage [email or web address] Password [facility]  05/27/2019, 4:57 AM

## 2019-05-26 NOTE — Code Documentation (Addendum)
Responded to Code Stroke called at 2036 for lower lip and L sided numbness, [id number]. Pt had been seen earlier at [facility] med center for r/o stroke with the same symptoms as she presents with now. On admission there her symptoms had resolved completely and she was sent home with plans for outpatient TIA workup. Once home, pt began having the same symptoms and came to [facility]. Once at [facility], symptoms resolved again. 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 Code Stroke cancelled. Plan for MRI and workup for complicated migraine.  Pt with prior h/o migraines and htn.

## 2019-05-26 NOTE — ED Provider Notes (Signed)
[facility] EMERGENCY DEPARTMENT Provider Note   CSN: [id number] Arrival date & time: 05/26/19  2016     History   Chief Complaint Chief Complaint  Patient presents with   Numbness    HPI Angela Wallace is a 58 y.o. female.     Angela Wallace is a 58 y.o. female with a history of hypertension and migraines, who presents to the emergency department for evaluation of numbness.  Patient reports that around 7 PM this evening she developed numbness over her left lower lip, left hand and forearm, and left upper thigh.  She describes numbness as a pins-and-needles sensation.  Symptoms lasted about 2 hours and then resolved on their own.  She denies any associated weakness.  No vision changes, speech difficulty, or dizziness.  She reports that now she is starting to develop a left-sided headache that feels like her typical migraine.  She has long history of migraines, and is followed with Dr. Neale Burly with neurology, typically uses baclofen as needed for migraine, has not taken any today.  Reports she has never had neurologic symptoms associated with her migraine.  She had a similar episode of numbness earlier this afternoon and was seen at [facility] ED, at this time had reassuring lab work and normal head CT, this was thought to be a TIA and patient was discharged home and told to return to the ED for MRI if any recurrence of symptoms.  She denies any associated chest pain or shortness of breath.  No abdominal pain, nausea or vomiting.  No fevers or recent sick contacts.  No neck pain or stiffness.  She does report that she has had increasing stress at work.  Has been having increasing migraines over the past 3 months.     Past Medical History:  Diagnosis Date   Hypertension    Migraines     There are no active problems to display for this patient.   Past Surgical History:  Procedure Laterality Date   ABDOMINAL HYSTERECTOMY     CYST EXCISION        OB History    Gravida  3   Para  2   Term  2   Preterm      AB  1   Living  2     SAB  1   TAB      Ectopic      Multiple      Live Births               Home Medications    Prior to Admission medications   Medication Sig Start Date End Date Taking? Authorizing Provider  aspirin EC 81 MG tablet Take 81 mg by mouth daily.    [provider]  BACLOFEN PO Take by mouth.    [provider]  butalbital-acetaminophen-caffeine (FIORICET, ESGIC) 50-325-40 MG tablet Take 1 tablet by mouth 2 (two) times daily as needed for migraine.    [provider]  CALCIUM PO Take 1 tablet by mouth daily.    [provider]  Coenzyme Q10 (COQ10) 200 MG CAPS Take 1 capsule by mouth daily.    [provider]  escitalopram (LEXAPRO) 20 MG tablet Take 20 mg by mouth daily.    [provider]  MAGNESIUM PO Take 1 tablet by mouth daily as needed (headache).    [provider]  methocarbamol (ROBAXIN) 500 MG tablet Take 500 mg by mouth daily as needed  for muscle spasms (migraine).    [provider]  olmesartan (BENICAR) 20 MG tablet Take 20 mg by mouth daily.    [provider]  Omega-3 Fatty Acids (FISH OIL PO) Take 1 capsule by mouth 2 (two) times daily.    [provider]  oxyCODONE-acetaminophen (PERCOCET/ROXICET) 5-325 MG tablet Take 1-2 tablets by mouth every 8 (eight) hours as needed for severe pain. 09/13/16   Benjiman CorePickering, Nathan, MD  TURMERIC PO Take 2 tablets by mouth daily.    [provider]    Family History Family History  Problem Relation Age of Onset   Heart failure Mother    Heart failure Father    Hypertension Other     Social History Social History   Tobacco Use   Smoking status: Former Smoker    Years: 5.00    Types: Cigarettes   Smokeless tobacco: Never Used  Substance Use Topics   Alcohol use: Yes    Comment: occasional   Drug use: No      Allergies   Codeine   Review of Systems Review of Systems  Constitutional: Negative for chills and fever.  HENT: Negative.   Eyes: Negative for visual disturbance.  Respiratory: Negative for cough and shortness of breath.   Cardiovascular: Negative for chest pain.  Gastrointestinal: Negative for abdominal pain, nausea and vomiting.  Genitourinary: Negative for dysuria.  Musculoskeletal: Negative for neck pain and neck stiffness.  Skin: Negative for color change and rash.  Neurological: Positive for numbness and headaches. Negative for dizziness, seizures, syncope, facial asymmetry, speech difficulty, weakness and light-headedness.     Physical Exam Updated Vital Signs BP 133/77    Pulse 66    Temp 98 F (36.7 C) (Oral)    Resp (!) 21    Ht 6' (1.829 m)    Wt 100.7 kg    SpO2 95%    BMI 30.11 kg/m   Physical Exam Vitals signs and nursing note reviewed.  Constitutional:      General: She is not in acute distress.    Appearance: Normal appearance. She is well-developed. She is not diaphoretic.     Comments: Alert, well-appearing and in no acute distress  HENT:     Head: Normocephalic and atraumatic.     Mouth/Throat:     Mouth: Mucous membranes are moist.     Pharynx: Oropharynx is clear.  Eyes:     General:        Right eye: No discharge.        Left eye: No discharge.     Extraocular Movements: Extraocular movements intact.     Pupils: Pupils are equal, round, and reactive to light.  Neck:     Musculoskeletal: Neck supple.  Cardiovascular:     Rate and Rhythm: Normal rate and regular rhythm.     Heart sounds: Normal heart sounds. No murmur. No friction rub. No gallop.   Pulmonary:     Effort: Pulmonary effort is normal. No respiratory distress.     Breath sounds: Normal breath sounds. No wheezing or rales.     Comments: Respirations equal and unlabored, patient able to speak in full sentences, lungs clear to auscultation bilaterally Abdominal:     General:  Bowel sounds are normal. There is no distension.     Palpations: Abdomen is soft. There is no mass.     Tenderness: There is no abdominal tenderness. There is no guarding.     Comments: Abdomen soft, nondistended, nontender to palpation  in all quadrants without guarding or peritoneal signs  Musculoskeletal:        General: No deformity.  Skin:    General: Skin is warm and dry.     Capillary Refill: Capillary refill takes less than 2 seconds.  Neurological:     Mental Status: She is alert.     Coordination: Coordination normal.     Comments: Neurological Exam:  Mental Status: Alert and oriented to person, place, and time. Attention and concentration normal. Speech clear. Recent memory is intact. Follows commands. Cranial Nerves: Visual fields grossly intact. EOMI and PERRLA. No nystagmus noted. Facial sensation intact at forehead, maxillary cheek, and chin/mandible bilaterally. No facial asymmetry or weakness. Hearing grossly normal. Uvula is midline, and palate elevates symmetrically. Normal SCM and trapezius strength. Tongue midline without fasciculations. Motor: Muscle strength 5/5 in proximal and distal UE and LE bilaterally. No pronator drift. Muscle tone normal. Reflexes: 2+ and symmetrical in all four extremities.  Sensation: Intact to light touch in upper and lower extremities distally bilaterally.  Gait: Normal without ataxia. Coordination: Normal FTN bilaterally.  Psychiatric:        Mood and Affect: Mood normal.        Behavior: Behavior normal.      ED Treatments / Results  Labs (all labs ordered are listed, but only abnormal results are displayed) Labs Reviewed  APTT - Abnormal; Notable for the following components:      Result Value   aPTT 37 (*)    All other components within normal limits  COMPREHENSIVE METABOLIC PANEL - Abnormal; Notable for the following components:   Potassium 3.1 (*)    Glucose, Bld 145 (*)    BUN 26 (*)    Creatinine, Ser 1.18 (*)    Total  Bilirubin 0.2 (*)    GFR calc non Af Amer 50 (*)    GFR calc Af Amer 58 (*)    All other components within normal limits  I-STAT CHEM 8, ED - Abnormal; Notable for the following components:   Potassium 3.0 (*)    BUN 27 (*)    Creatinine, Ser 1.10 (*)    Glucose, Bld 135 (*)    All other components within normal limits  SARS CORONAVIRUS 2 (TAT 6-24 HRS)  PROTIME-INR  CBC  DIFFERENTIAL  HIV ANTIBODY (ROUTINE TESTING W REFLEX)  HEMOGLOBIN A1C  LIPID PANEL  I-STAT BETA HCG BLOOD, ED ([facility], [facility], [facility] ONLY)    EKG EKG Interpretation  Date/Time:  Friday May 26 2019 21:00:21 EDT Ventricular Rate:  81 PR Interval:    QRS Duration: 148 QT Interval:  429 QTC Calculation: 498 R Axis:   63 Text Interpretation:  Sinus rhythm Left bundle branch block No significant change since last tracing Confirmed by Melene Plan [phone]) on 05/26/2019 10:44:23 PM   Radiology Ct Head Wo Contrast  Result Date: 05/26/2019 CLINICAL DATA:  Left facial and upper extremity numbness. EXAM: CT HEAD WITHOUT CONTRAST TECHNIQUE: Contiguous axial images were obtained from the base of the skull through the vertex without intravenous contrast. COMPARISON:  None. FINDINGS: Brain: No evidence of acute infarction, hemorrhage, hydrocephalus, extra-axial collection or mass lesion/mass effect. Vascular: No hyperdense vessel or unexpected calcification. Skull: Normal. Negative for fracture or focal lesion. Sinuses/Orbits: No acute finding. Other: None. IMPRESSION: Normal head CT. Electronically Signed   By: Lupita Raider M.D.   On: 05/26/2019 16:26   Mr Brain Wo Contrast  Result Date: 05/26/2019 CLINICAL DATA:  Hypertension and headache.  Left-sided  numbness. EXAM: MRI HEAD WITHOUT CONTRAST TECHNIQUE: Multiplanar, multiecho pulse sequences of the brain and surrounding structures were obtained without intravenous contrast. COMPARISON:  Head CT same day FINDINGS: Brain: Diffusion imaging suggests a very tiny acute infarction  along the lateral right thalamus. No other possible acute finding. Minimal small vessel change affects the pons. No focal cerebellar insult. Minimal small vessel changes seen within the hemispheric white matter. No cortical or large vessel territory infarction. No mass lesion, hemorrhage, hydrocephalus or extra-axial collection. Vascular: Major vessels at the base of the brain show flow. Skull and upper cervical spine: Negative Sinuses/Orbits: Clear/normal Other: None IMPRESSION: No definite acute finding. Question of a tiny acute infarction along the lateral right thalamus. Minimal small vessel change of the pons and hemispheric white matter. Electronically Signed   By: Paulina Fusi M.D.   On: 05/26/2019 21:52    Procedures Procedures (including critical care time)  Medications Ordered in ED Medications  sodium chloride flush (NS) 0.9 % injection 3 mL (has no administration in time range)     Initial Impression / Assessment and Plan / ED Course  I have reviewed the triage vital signs and the nursing notes.  Pertinent labs & imaging results that were available during my care of the patient were reviewed by me and considered in my medical decision making (see chart for details).  58 year old female who presents with reported numbness and tingling over the left lip, left arm and left upper thigh.  Code stroke activated by MD upon arrival to the emergency department.  On my evaluation patient reports symptoms lasted about 2 hours and then resolved.  She was seen earlier today at [facility] ED for similar episode of tingling and numbness on the left side which lasted about 2 hours and once again resolved, at this time she had a reassuring head CT and lab work, was diagnosed with likely TIA and told to return to the ED for any recurrence of symptoms.  Patient reports that 7 PM she began having tingling symptoms again that lasted approximately 2 hours, no associated weakness, speech changes.   Patient does report history of migraines and reports she is starting to get a left-sided headache.  While code stroke was initiated and lab work collected, CT was not done as this was done earlier today, Dr. Wilford Corner with neurology at bedside during evaluation.  Neurologic exam is completely normal at this time.  Given current headache there is also concern for possible complicated migraine but will plan to get MRI, will cancel code stroke.  Given normal neurologic exam Dr. Jerrell Belfast is okay with not repeating head CT at this time.  Stat MRI brain ordered.  Will also give headache cocktail while awaiting results.  Labs show no leukocytosis, normal hemoglobin.  Hypokalemia of 3.1, glucose of 147, creatinine 1.18, no other electrolyte derangements, normal liver function.  P.o. potassium replacement given.  MRI return, no definite acute finding noted but there is question of a tiny acute infarct along the lateral right thalamus, with minimal vessel change of the pons and hemispheric white matter.  I discussed these findings with Dr. Wilford Corner with neurology, given this possible small infarct Neuro recommends medicine admission for stroke work-up to help mitigate any potential risk factors that could lead to larger stroke in the future.  Consult placed for medicine admission.  Case discussed with Dr. Loney Loh with [facility] hospitalist who will see and admit the patient.  Final Clinical Impressions(s) / ED Diagnoses  Final diagnoses:  None    ED Discharge Orders    None       Jacqlyn Larsen, [place] 05/27/19 Carmi, [place], DO 05/28/19 1845

## 2019-05-26 NOTE — ED Notes (Signed)
Cancelled code stroke @ 8:47 PM

## 2019-05-26 NOTE — Consult Note (Addendum)
Neurology Consultation  Reason for Consult: Code stroke for left-sided numbness Referring Physician: Dr. Noemi Chapel  CC: Left-sided numbness  History is obtained from: Patient, chart  HPI: Angela Wallace is a 58 y.o. female past medical history of hypertension and migraines, not taking any preventive medication for migraines at this time, history of adverse reaction to Topamax in the form of joint swelling and adverse reactions to triptan's in the form of visual side effects, presents to the emergency room for left-sided facial arm leg numbness.  Also reported perioral numbness and now has a headache. She had symptoms earlier this morning, was seen and evaluated in the emergency room at [facility] for similar symptoms that are completely resolved.  She was given the option to come in for a evaluation or follow-up outpatient but given she had good outpatient follow-up decided to go for outpatient follow-up.  At around 6:30 PM today again, she had perioral numbness, left-sided face arm and leg numbness that lasted for a few minutes and then resolved.  Because of having had symptoms multiple times a day, she decided to come in to the ER as she had been advised from earlier in the day. Reports that she did not have a headache at that time but now she has a headache coming.  She has had migraines since the age of 58 years old.  She has been on Topamax but had adverse reactions.  She was also told not to be on any kind of a triptan because of visual loss that she has accrued over time. Reports excessive anxiety and some personal stressors with work and family life at this time. Denies any chest pain shortness of breath.  Denies any visual symptoms.  Denies any weakness.  Denies any fevers chills.  Denies any sick contacts.   LKW: 6:30 PM tpa given?: no, symptoms consistent with a complex migraine Premorbid modified Rankin scale (mRS): 0  ROS:  ROS was performed and is negative except as noted in  the HPI.    Past Medical History:  Diagnosis Date  . Hypertension   . Migraines      Family History  Problem Relation Age of Onset  . Heart failure Mother   . Heart failure Father   . Hypertension Other      Social History:   reports that she has quit smoking. Her smoking use included cigarettes. She quit after 5.00 years of use. She has never used smokeless tobacco. She reports current alcohol use. She reports that she does not use drugs.  Medications  Current Facility-Administered Medications:  .  sodium chloride 0.9 % bolus 500 mL, 500 mL, Intravenous, Once, Ford, Kelsey N, PA-C .  sodium chloride flush (NS) 0.9 % injection 3 mL, 3 mL, Intravenous, Once, Deno Etienne, DO  Current Outpatient Medications:  .  aspirin EC 81 MG tablet, Take 81 mg by mouth daily., Disp: , Rfl:  .  BACLOFEN PO, Take by mouth., Disp: , Rfl:  .  butalbital-acetaminophen-caffeine (FIORICET, ESGIC) 50-325-40 MG tablet, Take 1 tablet by mouth 2 (two) times daily as needed for migraine., Disp: , Rfl:  .  CALCIUM PO, Take 1 tablet by mouth daily., Disp: , Rfl:  .  Coenzyme Q10 (COQ10) 200 MG CAPS, Take 1 capsule by mouth daily., Disp: , Rfl:  .  escitalopram (LEXAPRO) 20 MG tablet, Take 20 mg by mouth daily., Disp: , Rfl:  .  MAGNESIUM PO, Take 1 tablet by mouth daily as needed (headache)., Disp: ,  Rfl:  .  methocarbamol (ROBAXIN) 500 MG tablet, Take 500 mg by mouth daily as needed for muscle spasms (migraine)., Disp: , Rfl:  .  olmesartan (BENICAR) 20 MG tablet, Take 20 mg by mouth daily., Disp: , Rfl:  .  Omega-3 Fatty Acids (FISH OIL PO), Take 1 capsule by mouth 2 (two) times daily., Disp: , Rfl:  .  oxyCODONE-acetaminophen (PERCOCET/ROXICET) 5-325 MG tablet, Take 1-2 tablets by mouth every 8 (eight) hours as needed for severe pain., Disp: 10 tablet, Rfl: 0 .  TURMERIC PO, Take 2 tablets by mouth daily., Disp: , Rfl:   Exam: Current vital signs: Ht 6' (1.829 m)   Wt 100.7 kg   BMI 30.11 kg/m   Vital signs in last 24 hours: Temp:  [98.1 F (36.7 C)-98.2 F (36.8 C)] 98.1 F (36.7 C) (09/25 1745) Pulse Rate:  [77-84] 78 (09/25 1745) Resp:  [16-20] 20 (09/25 1745) BP: (121-154)/(85-98) 154/86 (09/25 1745) SpO2:  [98 %-99 %] 99 % (09/25 1745) Weight:  [100.7 kg] 100.7 kg (09/25 2042)  GENERAL: Awake, alert in NAD HEENT: - Normocephalic and atraumatic, dry mm, no LN++, no Thyromegally LUNGS - Clear to auscultation bilaterally with no wheezes CV - S1S2 RRR, no m/r/g, equal pulses bilaterally. ABDOMEN - Soft, nontender, nondistended with normoactive BS Ext: warm, well perfused, intact peripheral pulses, no edema NEURO:  Mental Status: AA&Ox3  Language: speech is non-dysarthric.  Naming, repetition, fluency, and comprehension intact. Cranial Nerves: PERRL . EOMI, visual fields reveal a small area of field cut on the right-which she says it is her baseline, no facial asymmetry, facial sensation intact, hearing intact, tongue/uvula/soft palate midline, normal sternocleidomastoid and trapezius muscle strength. No evidence of tongue atrophy or fibrillations Motor: 5/5 with no vertical drift Tone: is normal and bulk is normal Sensation- Intact to light touch bilaterally, no extinction Coordination: FTN intact bilaterally Gait- deferred  NIHSS- 1 for visual field deficit that is baseline.  Delta NIH 0   Labs I have reviewed labs in epic and the results pertinent to this consultation are:  CBC    Component Value Date/Time   WBC 8.3 05/26/2019 2035   RBC 4.46 05/26/2019 2035   HGB 13.6 05/26/2019 2037   HCT 40.0 05/26/2019 2037   PLT 254 05/26/2019 2035   MCV 89.7 05/26/2019 2035   MCH 29.8 05/26/2019 2035   MCHC 33.3 05/26/2019 2035   RDW 13.2 05/26/2019 2035   LYMPHSABS 3.8 05/26/2019 2035   MONOABS 0.4 05/26/2019 2035   EOSABS 0.1 05/26/2019 2035   BASOSABS 0.0 05/26/2019 2035    CMP     Component Value Date/Time   NA 141 05/26/2019 2037   K 3.0 (L) 05/26/2019  2037   CL 103 05/26/2019 2037   CO2 27 05/26/2019 2035   GLUCOSE 135 (H) 05/26/2019 2037   BUN 27 (H) 05/26/2019 2037   CREATININE 1.10 (H) 05/26/2019 2037   CALCIUM 9.6 05/26/2019 2035   PROT 6.8 05/26/2019 2035   ALBUMIN 4.2 05/26/2019 2035   AST 21 05/26/2019 2035   ALT 22 05/26/2019 2035   ALKPHOS 71 05/26/2019 2035   BILITOT PENDING 05/26/2019 2035   GFRNONAA 50 (L) 05/26/2019 2035   GFRAA 58 (L) 05/26/2019 2035    Imaging I have reviewed the images obtained:  CT-scan of the brain-done earlier this evening- normal  Assessment: 58 year old with hypertension and history of migraines presenting with multiple episode of left facial perioral and left side of the body numbness.  Symptoms transient  and resolving. She does have risk factors for strokelike symptoms with a history of migraines makes me think this is more of a complex migraine than a stroke. Deficits are very mild and exam and history consistent with migraines-hence no TPA. No LVO signs on exam. We will evaluate with further imaging and treat with migraine cocktail.  Impression: Evaluate for complex migraine Evaluate for stroke-low on differentials  Recommendations: Stat MRI brain without contrast If no evidence of bleed or acute process, can try migraine cocktail IV. If symptoms resolve, can be discharged home with outpatient neurology follow-up If the imaging shows anything abnormal or symptoms persist, will reevaluate. Plan relayed to the ER provider.  Code stroke was canceled  -- Milon Dikes, MD [facility] Pager: [phone] If 7pm to 7am, please call on call as listed on AMION.   ADDENDUM MRI brain completed.  There is a very small area of faint restricted diffusion in the right thalamus which can correlate with the symptoms of the left-sided numbness-a fluctuating lacunar infarct. For that reason, I would recommend admission for stroke risk factor work-up.  Again, NIH 0 precluded TPA  use.  Clinical exam negative for signs of LVO hence not a candidate for thrombectomy.  Updated impression -Evaluate for small vessel etiology stroke in the right thalamus  Updated post imaging recommendations: Admit to hospitalist CTA head and neck 2D echo A1c Lipid panel Aspirin 325 Atorvastatin 80 PT OT speech therapy Stroke swallow evaluation Stroke team will follow with you  -- Milon Dikes, MD [facility] Pager: [phone] If 7pm to 7am, please call on call as listed on AMION.

## 2019-05-26 NOTE — ED Triage Notes (Signed)
   Patient comes in with possible stroke symptoms and was seen earlier by Dr. Maryan Rued at [facility] around 1500.  Patient was diagnosed with TIA and told to return to ED if symptoms returned.  Patient stated symptoms returned at 1900.  Patient has hx of migraines.  Was seen by Dr. Sabra Heck at bridge and stated no CT at this time.  Patient is A&O x4.  Patient says her left hand is still a little numb but feels better now.  Has headache right now that is L sided and 1/10.

## 2019-05-26 NOTE — ED Triage Notes (Addendum)
Pt reports one hour PTA had numbness to LT side of lower lip, then LT thumb, then LT fingers, then LT thigh; sts she took 2 baby ASA and sxs went away immediately

## 2019-05-26 NOTE — ED Notes (Signed)
Dr. Maryan Rued informed by Primary RN Vincente Liberty.

## 2019-05-27 ENCOUNTER — Observation Stay (HOSPITAL_BASED_OUTPATIENT_CLINIC_OR_DEPARTMENT_OTHER): Payer: No Typology Code available for payment source

## 2019-05-27 DIAGNOSIS — I10 Essential (primary) hypertension: Secondary | ICD-10-CM

## 2019-05-27 DIAGNOSIS — I6389 Other cerebral infarction: Secondary | ICD-10-CM

## 2019-05-27 DIAGNOSIS — F32A Depression, unspecified: Secondary | ICD-10-CM

## 2019-05-27 DIAGNOSIS — E876 Hypokalemia: Secondary | ICD-10-CM

## 2019-05-27 DIAGNOSIS — I639 Cerebral infarction, unspecified: Secondary | ICD-10-CM

## 2019-05-27 DIAGNOSIS — I447 Left bundle-branch block, unspecified: Secondary | ICD-10-CM

## 2019-05-27 DIAGNOSIS — F329 Major depressive disorder, single episode, unspecified: Secondary | ICD-10-CM

## 2019-05-27 LAB — BASIC METABOLIC PANEL
Anion gap: 9 (ref 5–15)
BUN: 22 mg/dL — ABNORMAL HIGH (ref 6–20)
CO2: 24 mmol/L (ref 22–32)
Calcium: 9.7 mg/dL (ref 8.9–10.3)
Chloride: 107 mmol/L (ref 98–111)
Creatinine, Ser: 1.11 mg/dL — ABNORMAL HIGH (ref 0.44–1.00)
GFR calc Af Amer: >60 mL/min (ref >60)
GFR calc non Af Amer: 54 mL/min — ABNORMAL LOW (ref >60)
Glucose, Bld: 156 mg/dL — ABNORMAL HIGH (ref 70–99)
Potassium: 4.6 mmol/L (ref 3.5–5.1)
Sodium: 140 mmol/L (ref 135–145)

## 2019-05-27 LAB — LIPID PANEL
Cholesterol: 229 mg/dL — ABNORMAL HIGH (ref 0–200)
HDL: 45 mg/dL (ref >40)
LDL Cholesterol: 175 mg/dL — ABNORMAL HIGH (ref 0–99)
Total CHOL/HDL Ratio: 5.1 RATIO
Triglycerides: 46 mg/dL (ref <150)
VLDL: 9 mg/dL (ref 0–40)

## 2019-05-27 LAB — ECHOCARDIOGRAM COMPLETE
Height: 72 in
Weight: 3552.05 oz

## 2019-05-27 LAB — HIV ANTIBODY (ROUTINE TESTING W REFLEX): HIV Screen 4th Generation wRfx: NONREACTIVE

## 2019-05-27 LAB — MAGNESIUM: Magnesium: 2.2 mg/dL (ref 1.7–2.4)

## 2019-05-27 LAB — SARS CORONAVIRUS 2 (TAT 6-24 HRS): SARS Coronavirus 2: NEGATIVE

## 2019-05-27 LAB — HEMOGLOBIN A1C
Hgb A1c MFr Bld: 5.7 % — ABNORMAL HIGH (ref 4.8–5.6)
Mean Plasma Glucose: 116.89 mg/dL

## 2019-05-27 MED ORDER — CLOPIDOGREL BISULFATE 75 MG PO TABS: 75.0000 mg | ORAL_TABLET | Freq: Every day | ORAL | 1 refills | Status: DC

## 2019-05-27 MED ORDER — ATORVASTATIN CALCIUM 80 MG PO TABS: 80.0000 mg | ORAL_TABLET | Freq: Every day | ORAL | 0 refills | Status: DC

## 2019-05-27 MED ORDER — ESCITALOPRAM OXALATE 20 MG PO TABS
20.0000 mg | ORAL_TABLET | Freq: Every day | ORAL | Status: DC
  Administered 2019-05-27: 10:00:00 20 mg via ORAL
  Filled 2019-05-27: qty 1

## 2019-05-27 MED ORDER — ACETAMINOPHEN 325 MG PO TABS: 650.0000 mg | ORAL_TABLET | ORAL | Status: DC | PRN

## 2019-05-27 MED ORDER — ATORVASTATIN CALCIUM 80 MG PO TABS
80.0000 mg | ORAL_TABLET | Freq: Every day | ORAL | Status: DC
  Administered 2019-05-27 (×2): 80 mg via ORAL
  Filled 2019-05-27 (×2): qty 1

## 2019-05-27 MED ORDER — CLOPIDOGREL BISULFATE 75 MG PO TABS: 75.0000 mg | ORAL_TABLET | Freq: Every day | ORAL | 1 refills | Status: AC

## 2019-05-27 MED ORDER — STROKE: EARLY STAGES OF RECOVERY BOOK
Freq: Once | Status: AC
  Administered 2019-05-27: 12:00:00
  Filled 2019-05-27: qty 1

## 2019-05-27 MED ORDER — ACETAMINOPHEN 650 MG RE SUPP: 650.0000 mg | RECTAL | Status: DC | PRN

## 2019-05-27 MED ORDER — ASPIRIN EC 325 MG PO TBEC
325.0000 mg | DELAYED_RELEASE_TABLET | Freq: Every day | ORAL | Status: DC
  Administered 2019-05-27: 325 mg via ORAL
  Filled 2019-05-27: qty 1

## 2019-05-27 MED ORDER — ENOXAPARIN SODIUM 40 MG/0.4ML ~~LOC~~ SOLN: 40.0000 mg | SUBCUTANEOUS | Status: DC

## 2019-05-27 MED ORDER — ACETAMINOPHEN 160 MG/5ML PO SOLN: 650.0000 mg | ORAL | Status: DC | PRN

## 2019-05-27 MED ORDER — CLOPIDOGREL BISULFATE 75 MG PO TABS
75.0000 mg | ORAL_TABLET | Freq: Once | ORAL | Status: AC
  Administered 2019-05-27: 75 mg via ORAL
  Filled 2019-05-27: qty 1

## 2019-05-27 NOTE — Progress Notes (Signed)
Pt refused wheelchair  Pt ambulated off unit with RN

## 2019-05-27 NOTE — Progress Notes (Signed)
STROKE TEAM PROGRESS NOTE   HISTORY OF PRESENT ILLNESS (per record) Angela Wallace is a 58 y.o. female past medical history of hypertension and migraines, not taking any preventive medication for migraines at this time, history of adverse reaction to Topamax in the form of joint swelling and adverse reactions to triptan's in the form of visual side effects, presents to the emergency room for left-sided facial arm leg numbness.  Also reported perioral numbness and now has a headache. She had symptoms earlier this morning, was seen and evaluated in the emergency room at [facility] for similar symptoms that are completely resolved.  She was given the option to come in for a evaluation or follow-up outpatient but given she had good outpatient follow-up decided to go for outpatient follow-up.  At around 6:30 PM today again, she had perioral numbness, left-sided face arm and leg numbness that lasted for a few minutes and then resolved.  Because of having had symptoms multiple times a day, she decided to come in to the ER as she had been advised from earlier in the day. Reports that she did not have a headache at that time but now she has a headache coming.  She has had migraines since the age of 58 years old.  She has been on Topamax but had adverse reactions.  She was also told not to be on any kind of a triptan because of visual loss that she has accrued over time. Reports excessive anxiety and some personal stressors with work and family life at this time. Denies any chest pain shortness of breath.  Denies any visual symptoms.  Denies any weakness.  Denies any fevers chills.  Denies any sick contacts.  LKW: 6:30 PM tpa given?: no, symptoms consistent with a complex migraine Premorbid modified Rankin scale (mRS): 0  INTERVAL HISTORY She has had migraines since the age of 6. Migraine with aura. Discussed stroke of risk in women with migraines with aura, there is increased risk for stroke in women with  migraine with aura and a contraindication for the combined contraceptive pill for use by women who have migraine with aura. The risk for women with migraine without aura is lower. However other risk factors like smoking are far more likely to increase stroke risk than migraine. There is a recommendation for no smoking and for the use of OCPs without estrogen such as progestogen only pills particularly for women with migraine with aura.Marland Kitchen People who have migraine headaches with auras may be 3 times more likely to have a stroke caused by a blood clot, compared to migraine patients who don't see auras. Women who take hormone-replacement therapy may be 30 percent more likely to suffer a clot-based stroke than women not taking medication containing estrogen. Other risk factors like smoking and high blood pressure may be  much more important.  She takes aspirin. She is a prior smoker. Her cholesterol is elevated ldl 175. Will change to asa and plavix for 3 weeks then plavix alone. Needs to address cholesterol and other vascular risk factors. She only had perioral and left forearm sensory changes and they are almost resolved.   OBJECTIVE Vitals:   05/27/19 0832 05/27/19 1025 05/27/19 1221 05/27/19 1445  BP: (!) 186/91 (!) 165/91 131/66 (!) 141/84  Pulse: 80 88 79 86  Resp: Temp: 97.9 F (36.6 C)     TempSrc: Oral     SpO2: 96% 95% 96% 95%  Weight:  Height:        CBC:  Recent Labs  Lab 05/26/19 1521 05/26/19 2035 05/26/19 2037  WBC 7.9 8.3  --   NEUTROABS  --  4.0  --   HGB 12.5 13.3 13.6  HCT 38.3 40.0 40.0  MCV 89.5 89.7  --   PLT 250 254  --     Basic Metabolic Panel:  Recent Labs  Lab 05/26/19 2035 05/26/19 2037 05/27/19 0532  NA 140 141 140  K 3.1* 3.0* 4.6  CL 99 103 107  CO2 27  --  24  GLUCOSE 145* 135* 156*  BUN 26* 27* 22*  CREATININE 1.18* 1.10* 1.11*  CALCIUM 9.6  --  9.7  MG  --   --  2.2    Lipid Panel:     Component Value Date/Time   CHOL  229 (H) 05/27/2019 0532   TRIG 46 05/27/2019 0532   HDL 45 05/27/2019 0532   CHOLHDL 5.1 05/27/2019 0532   VLDL 9 05/27/2019 0532   LDLCALC 175 (H) 05/27/2019 0532   HgbA1c:  Lab Results  Component Value Date   HGBA1C 5.7 (H) 05/27/2019   Urine Drug Screen:     Component Value Date/Time   LABOPIA NONE DETECTED 05/26/2019 1635   COCAINSCRNUR NONE DETECTED 05/26/2019 1635   LABBENZ NONE DETECTED 05/26/2019 1635   AMPHETMU NONE DETECTED 05/26/2019 1635   THCU NONE DETECTED 05/26/2019 1635   LABBARB NONE DETECTED 05/26/2019 1635    Alcohol Level No results found for: ETH  IMAGING   Ct Head Wo Contrast  Result Date: 05/26/2019 CLINICAL DATA:  Left facial and upper extremity numbness. EXAM: CT HEAD WITHOUT CONTRAST TECHNIQUE: Contiguous axial images were obtained from the base of the skull through the vertex without intravenous contrast. COMPARISON:  None. FINDINGS: Brain: No evidence of acute infarction, hemorrhage, hydrocephalus, extra-axial collection or mass lesion/mass effect. Vascular: No hyperdense vessel or unexpected calcification. Skull: Normal. Negative for fracture or focal lesion. Sinuses/Orbits: No acute finding. Other: None. IMPRESSION: Normal head CT. Electronically Signed   By: Lupita RaiderJames  Green Jr M.D.   On: 05/26/2019 16:26   Mr Brain Wo Contrast  Result Date: 05/26/2019 CLINICAL DATA:  Hypertension and headache.  Left-sided numbness. EXAM: MRI HEAD WITHOUT CONTRAST TECHNIQUE: Multiplanar, multiecho pulse sequences of the brain and surrounding structures were obtained without intravenous contrast. COMPARISON:  Head CT same day FINDINGS: Brain: Diffusion imaging suggests a very tiny acute infarction along the lateral right thalamus. No other possible acute finding. Minimal small vessel change affects the pons. No focal cerebellar insult. Minimal small vessel changes seen within the hemispheric white matter. No cortical or large vessel territory infarction. No mass lesion,  hemorrhage, hydrocephalus or extra-axial collection. Vascular: Major vessels at the base of the brain show flow. Skull and upper cervical spine: Negative Sinuses/Orbits: Clear/normal Other: None IMPRESSION: No definite acute finding. Question of a tiny acute infarction along the lateral right thalamus. Minimal small vessel change of the pons and hemispheric white matter. Electronically Signed   By: Paulina FusiMark  Shogry M.D.   On: 05/26/2019 21:52   Vas [place] Carotid (at [facility])  Result Date: 05/27/2019 Carotid Arterial Duplex Study Indications:       TIA and Weakness. Risk Factors:      Hypertension. Other Factors:     Migraines. Comparison Study:  No previous study available Performing Technologist: Toma DeitersVirginia Slaughter RVS  Examination Guidelines: A complete evaluation includes B-mode imaging, spectral Doppler, color Doppler, and power Doppler as  needed of all accessible portions of each vessel. Bilateral testing is considered an integral part of a complete examination. Limited examinations for reoccurring indications may be performed as noted.  Right Carotid Findings: +----------+-------+--------+--------+----------------+-----------------------+           PSV    EDV cm/sStenosisPlaque          Comments                          cm/s                   Description                             +----------+-------+--------+--------+----------------+-----------------------+ CCA Prox  93     11                              mild intimal wall                                                        changes                 +----------+-------+--------+--------+----------------+-----------------------+ CCA Distal112    25                              mild intimal wall                                                        changes                 +----------+-------+--------+--------+----------------+-----------------------+ ICA Prox  64     13                                                       +----------+-------+--------+--------+----------------+-----------------------+ ICA Mid   70     18                                                      +----------+-------+--------+--------+----------------+-----------------------+ ICA Distal71     23                                                      +----------+-------+--------+--------+----------------+-----------------------+ ECA       113    16              heterogenous    minimal at the origin   +----------+-------+--------+--------+----------------+-----------------------+ +----------+--------+-------+--------+-------------------+           PSV cm/sEDV cmsDescribeArm Pressure (mmHG) +----------+--------+-------+--------+-------------------+  Subclavian131                                        +----------+--------+-------+--------+-------------------+ +---------+--------+--+--------+--+ VertebralPSV cm/s49EDV cm/s16 +---------+--------+--+--------+--+  Left Carotid Findings: +----------+--------+--------+--------+----------------------+-----------------+           PSV cm/sEDV cm/sStenosisPlaque Description    Comments          +----------+--------+--------+--------+----------------------+-----------------+ CCA Prox  120     24                                    mild intimal wall                                                         changes           +----------+--------+--------+--------+----------------------+-----------------+ CCA Distal116     30                                                      +----------+--------+--------+--------+----------------------+-----------------+ ICA Prox  93      24      1-39%   heterogenous and focalminimal plaque    +----------+--------+--------+--------+----------------------+-----------------+ ICA Mid   63      16                                    tortuous           +----------+--------+--------+--------+----------------------+-----------------+ ICA Distal120     37                                    tortuous          +----------+--------+--------+--------+----------------------+-----------------+ ECA       111     21              heterogenous          minimal plaque    +----------+--------+--------+--------+----------------------+-----------------+ +----------+--------+--------+--------+-------------------+           PSV cm/sEDV cm/sDescribeArm Pressure (mmHG) +----------+--------+--------+--------+-------------------+ [id number]                                         +----------+--------+--------+--------+-------------------+ +---------+--------+--+--------+--+ VertebralPSV cm/s39EDV cm/s11 +---------+--------+--+--------+--+  Summary: Right Carotid: There is no evidence of stenosis in the right ICA. Left Carotid: Velocities in the left ICA are consistent with a 1-39% stenosis. Vertebrals:  Bilateral vertebral arteries demonstrate antegrade flow. Subclavians: Normal flow hemodynamics were seen in bilateral subclavian              arteries. *See table(s) above for measurements and observations.     Preliminary      Transthoracic Echocardiogram  05/27/2019 IMPRESSIONS  1. Left ventricular ejection fraction, by visual estimation, is 60 to 65%. The left ventricle has normal function. Left ventricular septal  wall thickness was mildly increased. Mildly increased left ventricular posterior wall thickness. There is mildly increased left ventricular hypertrophy.  2. Elevated mean left atrial pressure.  3. Left ventricular diastolic Doppler parameters are consistent with impaired relaxation pattern of LV diastolic filling.  4. Global right ventricle has normal systolic function.The right ventricular size is normal. No increase in right ventricular wall thickness.  5. Left atrial size was normal.  6. Right atrial size was normal.  7. The  mitral valve is normal in structure. No evidence of mitral valve regurgitation. No evidence of mitral stenosis.  8. The tricuspid valve is normal in structure. Tricuspid valve regurgitation was not visualized by color flow Doppler.  9. The aortic valve has an indeterminant number of cusps Aortic valve regurgitation was not visualized by color flow Doppler. Structurally normal aortic valve, with no evidence of sclerosis or stenosis. 10. The pulmonic valve was not well visualized. Pulmonic valve regurgitation is not visualized by color flow Doppler. 11. Mildly dilated proximal ascending aorta 3.9 cm.  Bilateral Carotid Dopplers  05/27/2019 Pending  ECG - - SR rate 81  BPM. LBBB (See cardiology reading for complete details)   PHYSICAL EXAM Blood pressure (!) 141/84, pulse 86, temperature 97.9 F (36.6 C), temperature source Oral, resp. rate 17, height 6' (1.829 m), weight 100.7 kg, SpO2 95 %.   Exam: NAD, pleasant                  Speech:    Speech is normal; fluent and spontaneous with normal comprehension.  Cognition:    The patient is oriented to person, place, and time;     recent and remote memory intact;     language fluent;    Cranial Nerves:    The pupils are equal, round, and reactive to light.Trigeminal sensation is intact and the muscles of mastication are normal. The face is symmetric. The palate elevates in the midline. Hearing intact. Voice is normal. Shoulder shrug is normal. The tongue has normal motion without fasciculations.   Coordination:  No dysmetria  Motor Observation:    No asymmetry, no atrophy, and no involuntary movements noted. Tone:    Normal muscle tone.     Strength:    Strength is V/V in the upper and lower limbs.      Sensation: slight decrease left forearm otherwise normal     ASSESSMENT/PLAN Angela Wallace is a 58 y.o. female with history of Htn and migraines presenting with multiple transient episodes of perioral numbness,  left-sided face arm and leg numbness that lasted for a few. She did not receive IV t-PA due to symptoms being c/w migraines.  Migraines vs small stroke:  Question of a tiny acute infarction along the lateral right thalamus.small vessel.  Code Stroke CT Head - not ordered  CT head - negative  MRI head - No definite acute finding. Question of a tiny acute infarction along the lateral right thalamus. Minimal small vessel change of the pons and hemispheric white matter.  MRA head - not ordered  CTA H&N - not ordered  CT Perfusion - not ordered  Carotid Doppler - pending  2D Echo - EF 60 - 65%. No cardiac source of emboli identified.  Mildly dilated proximal ascending aorta 3.9 cm.  Sars Corona Virus 2  - negative  LDL - 175  HgbA1c - 5.7  UDS - negative  VTE prophylaxis - Lovenox  Diet - Heart healthy / carb modified with thin liquids.  aspirin  81 mg daily prior to admission, now on aspirin 81 mg daily and clopidogrel 75 mg daily for 3 weeks then Plavix alone  Patient counseled to be compliant with her antithrombotic medications  Ongoing aggressive stroke risk factor management  Therapy recommendations:  No PT or OT F/U  Disposition:  Pending  Hypertension  Home BP meds: Cozaar and HCTZ  Current BP meds: none  BP mildly low at times . Permissive hypertension (OK if < 220/120) but gradually normalize in 5-7 days . Long-term BP goal normotensive  Hyperlipidemia  Home Lipid lowering medication:  none  LDL 175, goal < 70  Current lipid lowering medication: Lipitor 80 mg daily   Continue statin at discharge  Other Stroke Risk Factors  Advanced age  Former cigarette smoker - quit  ETOH use, advised to drink no more than 1 alcoholic beverage per day.  Obesity, Body mass index is 30.11 kg/m., recommend weight loss, diet and exercise as appropriate   Migraines  Other Active Problems   Mildly dilated proximal ascending aorta 3.9 cm. By  echo.  LBBB  Creatinine 1.11  Hyperglycemia  Hypokalemia -> corrected  PLAN  Aspirin 81 mg daily and Plavix 75 mg daily for 3 weeks then Plavix 75 mg daily alone  Neurology follow up.    Hospital day # 0  Personally examined patient and images, and have participated in and made any corrections needed to history, physical, neuro exam,assessment and plan as stated above.  I have personally obtained the history, evaluated lab date, reviewed imaging studies and agree with radiology interpretations.    Naomie Dean, MD Stroke Neurology   A total of 35 minutes was spent for the care of this patient, spent on counseling patient and family on different diagnostic and therapeutic options, counseling and coordination of care, riskd ans benefits of management, compliance, or risk factor reduction and education.   To contact Stroke Continuity provider, please refer to [email or web address]. After hours, contact General Neurology

## 2019-05-27 NOTE — Evaluation (Signed)
Occupational Therapy Evaluation Patient Details Name: Angela Wallace MRN: [id number] DOB: 08-13-1961 Today's Date: 05/27/2019    History of Present Illness 58 y.o. female with medical history significant of hypertension, migraines presenting to the ED with complaints of left-sided numbness. MRI showing no acute abnormalities; question of small acute infarction along the lateral right thalamus.   Clinical Impression   PTA, pt was living with her father and was independent and working full-time. Pt presenting at baseline functional for ADLs and functional mobility. Pt reporting the numbness at her left face and UE has resolved; though she did have symptoms on and off last night. Educating pt on BEFAST for stroke signs and symptoms. Recommend dc to home once medically stable per phsyician. All acute OT needs mets and will sign off.     Follow Up Recommendations  No OT follow up    Equipment Recommendations  None recommended by OT    Recommendations for Other Services       Precautions / Restrictions Precautions Precautions: None      Mobility Bed Mobility Overal bed mobility: Independent                Transfers Overall transfer level: Independent                    Balance Overall balance assessment: Independent                                         ADL either performed or assessed with clinical judgement   ADL Overall ADL's : Independent                                       General ADL Comments: Pt performing ADLs and functional mobility at baseline. Pt demonstrating baseline balance, strength, and coorindation. Able to perform simple money management question and trail making task.      Vision Baseline Vision/History: Wears glasses Wears Glasses: Reading only Patient Visual Report: No change from baseline       Perception     Praxis      Pertinent Vitals/Pain Pain Assessment: No/denies pain     Hand  Dominance Right   Extremity/Trunk Assessment Upper Extremity Assessment Upper Extremity Assessment: Overall WFL for tasks assessed   Lower Extremity Assessment Lower Extremity Assessment: Overall WFL for tasks assessed   Cervical / Trunk Assessment Cervical / Trunk Assessment: Normal   Communication Communication Communication: No difficulties   Cognition Arousal/Alertness: Awake/alert Behavior During Therapy: WFL for tasks assessed/performed Overall Cognitive Status: Within Functional Limits for tasks assessed                                 General Comments: Slightly impulsive but feel this is baseline   General Comments       Exercises     Shoulder Instructions      Home Living Family/patient expects to be discharged to:: Private residence Living Arrangements: Parent                               Additional Comments: Lives with her father      Prior Functioning/Environment Level of Independence: Independent  Comments: Works [employer] Problem List: Impaired balance (sitting and/or standing);Decreased coordination      OT Treatment/Interventions:      OT Goals(Current goals can be found in the care plan section) Acute Rehab OT Goals Patient Stated Goal: "Hopefully go home today OT Goal Formulation: All assessment and education complete, DC therapy  OT Frequency:     Barriers to D/C:            Co-evaluation              AM-PAC OT "6 Clicks" Daily Activity     Outcome Measure Help from another person eating meals?: None Help from another person taking care of personal grooming?: None Help from another person toileting, which includes using toliet, bedpan, or urinal?: None Help from another person bathing (including washing, rinsing, drying)?: None Help from another person to put on and taking off regular upper body clothing?: None Help from another person to put on and  taking off regular lower body clothing?: None 6 Click Score: 24   End of Session Nurse Communication: Mobility status  Activity Tolerance: Patient tolerated treatment well Patient left: in chair;with call bell/phone within reach  OT Visit Diagnosis: Muscle weakness (generalized) (M62.81)                Time: 1740-8144 OT Time Calculation (min): 19 min Charges:  OT General Charges $OT Visit: 1 Visit OT Evaluation $OT Eval Low Complexity: 1 Low  Angela Wallace MSOT, OTR/L Acute Rehab Pager: [phone] Office: [phone]  Angela Wallace 05/27/2019, 3:22 PM

## 2019-05-27 NOTE — Progress Notes (Signed)
Stroke: mapping our early stages of recovery book provided to patient at bedside  Pt verbalizes understanding through teach back

## 2019-05-27 NOTE — Progress Notes (Signed)
Pt states her pharmacy is closed  Informed MD  provided pt Lipitor and Plavix doses for this evening and a printed prescriptions  Pt IV removed, catheter intact and telemetry removed  Pt has all belongings including printed prescriptions, AVS and stroke book Awaiting pt discharge transportation

## 2019-05-27 NOTE — ED Notes (Signed)
Pt desating during sleep. Placed on 2L . Pt states she has probable sleep apnea but has not been tested for it

## 2019-05-27 NOTE — Discharge Summary (Signed)
Physician Discharge Summary  Angela Wallace GMW:[id number] DOB: 1960/10/14 DOA: 05/26/2019  PCP: Redmond School, MD  Admit date: 05/26/2019 Discharge date: 05/27/2019  Admitted From: home Discharge disposition: home   Recommendations for Outpatient Follow-Up:   1. Statin added for better lipid control LDL 175 2. ASA plus plavix for 3 weeks then plavix alone   Discharge Diagnosis:   Principal Problem:   Acute CVA (cerebrovascular accident) ([place]) Active Problems:   Hypokalemia   LBBB (left bundle branch block)   HTN (hypertension)   Depression    Discharge Condition: Improved.  Diet recommendation: Low sodium, heart healthy  Wound care: None.  Code status: Full.   History of Present Illness:   [name] is a 58 y.o. female with history of Htn and migraines presenting with multiple transient episodes of perioral numbness, left-sided face arm and leg numbness that lasted for a few. She did not receive IV t-PA due to symptoms being c/w migraines.   Hospital Course by Problem:     Migraines vs small stroke:  Question of a tiny acute infarction along the lateral right thalamus.small vessel.  MRI head - No definite acute finding. Question of a tiny acute infarction along the lateral right thalamus. Minimal small vessel change of the pons and hemispheric white matter.  Carotid Doppler - no evidence of stenosis  2D Echo - EF 60 - 65%. No cardiac source of emboli identified.  Mildly dilated proximal ascending aorta 3.9 cm.  LDL - 175- statin added  HgbA1c - 5.7  aspirin 81 mg daily prior to admission, now on aspirin 81 mg daily and clopidogrel 75 mg daily for 3 weeks then Plavix alone   Hypertension  Home BP meds: Cozaar and HCTZ  Long-term BP goal normotensive  Hyperlipidemia  Home Lipid lowering medication:  none  LDL 175, goal < 70  Current lipid lowering medication: Lipitor 80 mg daily   Continue statin at  discharge  Hypokalemia -repleted     Medical Consultants:    neurology  Discharge Exam:   Vitals:   05/27/19 1221 05/27/19 1445  BP: 131/66 (!) 141/84  Pulse: 79 86  Resp: 17 17  Temp:    SpO2: 96% 95%   Vitals:   05/27/19 0832 05/27/19 1025 05/27/19 1221 05/27/19 1445  BP: (!) 186/91 (!) 165/91 131/66 (!) 141/84  Pulse: 80 88 79 86  Resp: 18 19 17 17   Temp: 97.9 F (36.6 C)     TempSrc: Oral     SpO2: 96% 95% 96% 95%  Weight:      Height:        General exam: Appears calm and comfortable.    The results of significant diagnostics from this hospitalization (including imaging, microbiology, ancillary and laboratory) are listed below for reference.     Procedures and Diagnostic Studies:   Ct Head Wo Contrast  Result Date: 05/26/2019 CLINICAL DATA:  Left facial and upper extremity numbness. EXAM: CT HEAD WITHOUT CONTRAST TECHNIQUE: Contiguous axial images were obtained from the base of the skull through the vertex without intravenous contrast. COMPARISON:  None. FINDINGS: Brain: No evidence of acute infarction, hemorrhage, hydrocephalus, extra-axial collection or mass lesion/mass effect. Vascular: No hyperdense vessel or unexpected calcification. Skull: Normal. Negative for fracture or focal lesion. Sinuses/Orbits: No acute finding. Other: None. IMPRESSION: Normal head CT. Electronically Signed   By: Marijo Conception M.D.   On: 05/26/2019 16:26   Mr Brain Wo Contrast  Result Date:  05/26/2019 CLINICAL DATA:  Hypertension and headache.  Left-sided numbness. EXAM: MRI HEAD WITHOUT CONTRAST TECHNIQUE: Multiplanar, multiecho pulse sequences of the brain and surrounding structures were obtained without intravenous contrast. COMPARISON:  Head CT same day FINDINGS: Brain: Diffusion imaging suggests a very tiny acute infarction along the lateral right thalamus. No other possible acute finding. Minimal small vessel change affects the pons. No focal cerebellar insult. Minimal  small vessel changes seen within the hemispheric white matter. No cortical or large vessel territory infarction. No mass lesion, hemorrhage, hydrocephalus or extra-axial collection. Vascular: Major vessels at the base of the brain show flow. Skull and upper cervical spine: Negative Sinuses/Orbits: Clear/normal Other: None IMPRESSION: No definite acute finding. Question of a tiny acute infarction along the lateral right thalamus. Minimal small vessel change of the pons and hemispheric white matter. Electronically Signed   By: Paulina Fusi M.D.   On: 05/26/2019 21:52   Vas US Carotid (at [facility])  Result Date: 05/27/2019 Carotid Arterial Duplex Study Indications:       TIA and Weakness. Risk Factors:      Hypertension. Other Factors:     Migraines. Comparison Study:  No previous study available Performing Technologist: Toma Deiters RVS  Examination Guidelines: A complete evaluation includes B-mode imaging, spectral Doppler, color Doppler, and power Doppler as needed of all accessible portions of each vessel. Bilateral testing is considered an integral part of a complete examination. Limited examinations for reoccurring indications may be performed as noted.  Right Carotid Findings: +----------+-------+--------+--------+----------------+-----------------------+             PSV     EDV cm/s Stenosis Plaque           Comments                             cm/s                      Description                               +----------+-------+--------+--------+----------------+-----------------------+  CCA Prox   93      11                                 mild intimal wall                                                               changes                  +----------+-------+--------+--------+----------------+-----------------------+  CCA Distal 112     25                                 mild intimal wall  changes                   +----------+-------+--------+--------+----------------+-----------------------+  ICA Prox   64      13                                                          +----------+-------+--------+--------+----------------+-----------------------+  ICA Mid    70      18                                                          +----------+-------+--------+--------+----------------+-----------------------+  ICA Distal 71      23                                                          +----------+-------+--------+--------+----------------+-----------------------+  ECA        113     16                heterogenous     minimal at the origin    +----------+-------+--------+--------+----------------+-----------------------+ +----------+--------+-------+--------+-------------------+             PSV cm/s EDV cms Describe Arm Pressure (mmHG)  +----------+--------+-------+--------+-------------------+  Subclavian 131                                            +----------+--------+-------+--------+-------------------+ +---------+--------+--+--------+--+  Vertebral PSV cm/s 49 EDV cm/s 16  +---------+--------+--+--------+--+  Left Carotid Findings: +----------+--------+--------+--------+----------------------+-----------------+             PSV cm/s EDV cm/s Stenosis Plaque Description     Comments           +----------+--------+--------+--------+----------------------+-----------------+  CCA Prox   120      24                                       mild intimal wall                                                                changes            +----------+--------+--------+--------+----------------------+-----------------+  CCA Distal 116      30                                                          +----------+--------+--------+--------+----------------------+-----------------+  ICA Prox   93  24       1-39%    heterogenous and focal minimal plaque      +----------+--------+--------+--------+----------------------+-----------------+  ICA Mid    63       16                                       tortuous           +----------+--------+--------+--------+----------------------+-----------------+  ICA Distal 120      37                                       tortuous           +----------+--------+--------+--------+----------------------+-----------------+  ECA        111      21                heterogenous           minimal plaque     +----------+--------+--------+--------+----------------------+-----------------+ +----------+--------+--------+--------+-------------------+             PSV cm/s EDV cm/s Describe Arm Pressure (mmHG)  +----------+--------+--------+--------+-------------------+  Subclavian 123                                             +----------+--------+--------+--------+-------------------+ +---------+--------+--+--------+--+  Vertebral PSV cm/s 39 EDV cm/s 11  +---------+--------+--+--------+--+  Summary: Right Carotid: There is no evidence of stenosis in the right ICA. Left Carotid: Velocities in the left ICA are consistent with a 1-39% stenosis. Vertebrals:  Bilateral vertebral arteries demonstrate antegrade flow. Subclavians: Normal flow hemodynamics were seen in bilateral subclavian              arteries. *See table(s) above for measurements and observations.     Preliminary      Labs:   Basic Metabolic Panel: Recent Labs  Lab 05/26/19 1521 05/26/19 2035 05/26/19 2037 05/27/19 0532  NA 138 140 141 140  K 3.3* 3.1* 3.0* 4.6  CL 102 99 103 107  CO2 24 27  --  24  GLUCOSE 115* 145* 135* 156*  BUN 31* 26* 27* 22*  CREATININE 1.05* 1.18* 1.10* 1.11*  CALCIUM 9.4 9.6  --  9.7  MG  --   --   --  2.2   GFR Estimated Creatinine Clearance: 71.6 mL/min (A) (by C-G formula based on SCr of 1.11 mg/dL (H)). Liver Function Tests: Recent Labs  Lab 05/26/19 1521 05/26/19 2035  AST 19 21  ALT 21 22  ALKPHOS 73 71  BILITOT 0.3 0.2*    PROT 7.1 6.8  ALBUMIN 4.3 4.2   No results for input(s): LIPASE, AMYLASE in the last 168 hours. No results for input(s): AMMONIA in the last 168 hours. Coagulation profile Recent Labs  Lab 05/26/19 2035  INR 1.0    CBC: Recent Labs  Lab 05/26/19 1521 05/26/19 2035 05/26/19 2037  WBC 7.9 8.3  --   NEUTROABS  --  4.0  --   HGB 12.5 13.3 13.6  HCT 38.3 40.0 40.0  MCV 89.5 89.7  --   PLT 250 254  --    Cardiac Enzymes: No results for input(s): CKTOTAL, CKMB, CKMBINDEX, TROPONINI in the last 168 hours. BNP: Invalid input(s): POCBNP CBG:  No results for input(s): GLUCAP in the last 168 hours. D-Dimer No results for input(s): DDIMER in the last 72 hours. Hgb A1c Recent Labs    05/27/19 0532  HGBA1C 5.7*   Lipid Profile Recent Labs    05/27/19 0532  CHOL 229*  HDL 45  LDLCALC 175*  TRIG 46  CHOLHDL 5.1   Thyroid function studies No results for input(s): TSH, T4TOTAL, T3FREE, THYROIDAB in the last 72 hours.  Invalid input(s): FREET3 Anemia work up No results for input(s): VITAMINB12, FOLATE, FERRITIN, TIBC, IRON, RETICCTPCT in the last 72 hours. Microbiology Recent Results (from the past 240 hour(s))  SARS CORONAVIRUS 2 (TAT 6-24 HRS) Nasopharyngeal Nasopharyngeal Swab     Status: None   Collection Time: 05/26/19 11:10 PM   Specimen: Nasopharyngeal Swab  Result Value Ref Range Status   SARS Coronavirus 2 NEGATIVE NEGATIVE Final    Comment: (NOTE) SARS-CoV-2 target nucleic acids are NOT DETECTED. The SARS-CoV-2 RNA is generally detectable in upper and lower respiratory specimens during the acute phase of infection. Negative results do not preclude SARS-CoV-2 infection, do not rule out co-infections with other pathogens, and should not be used as the sole basis for treatment or other patient management decisions. Negative results must be combined with clinical observations, patient history, and epidemiological information. The expected result is  Negative. Fact Sheet for Patients: HairSlick.nohttps://www.fda.gov/media/138098/download Fact Sheet for Healthcare Providers: quierodirigir.comhttps://www.fda.gov/media/138095/download This test is not yet approved or cleared by the [place] FDA and  has been authorized for detection and/or diagnosis of SARS-CoV-2 by FDA under an Emergency Use Authorization (EUA). This EUA will remain  in effect (meaning this test can be used) for the duration of the COVID-19 declaration under Section 56 4(b)(1) of the Act, 21 U.S.C. section 360bbb-3(b)(1), unless the authorization is terminated or revoked sooner. Performed at [facility], [address] [place], [place] [address]      Discharge Instructions:   Discharge Instructions    Ambulatory referral to Neurology   Complete by: As directed    An appointment is requested in approximately: 6 weeks   Diet - low sodium heart healthy   Complete by: As directed    Discharge instructions   Complete by: As directed    Need better control of cholesterol ASA/plavix together for 3 weeks then plavix alone   Increase activity slowly   Complete by: As directed      Allergies as of 05/27/2019      Reactions   Chocolate Other (See Comments)   Consuming a little more than usual can trigger a migraine   Citrus Other (See Comments)   Consuming a little more than usual can trigger a migraine   Codeine Other (See Comments)   Headache   Nitrates, Organic Other (See Comments)   Consuming a little more than usual can trigger a migraine   Peanut-containing Drug Products Other (See Comments)   Consuming a little more than usual can trigger a migraine   Pineapple Other (See Comments)   Consuming a little more than usual can trigger a migraine      Medication List    STOP taking these medications   oxyCODONE-acetaminophen 5-325 MG tablet Commonly known as: PERCOCET/ROXICET     TAKE these medications   aspirin EC 81 MG tablet Take 81 mg by mouth daily.    atorvastatin 80 MG tablet Commonly known as: LIPITOR Take 1 tablet (80 mg total) by mouth daily at 6 PM.   BACLOFEN PO Take  1 tablet by mouth 3 (three) times daily as needed (for migraine-related tension).   butalbital-acetaminophen-caffeine 50-325-40 MG tablet Commonly known as: FIORICET Take 1 tablet by mouth 2 (two) times daily as needed for migraine.   CALCIUM PO Take 1 tablet by mouth daily.   clopidogrel 75 MG tablet Commonly known as: Plavix Take 1 tablet (75 mg total) by mouth daily.   CoQ10 200 MG Caps Take 200 mg by mouth daily.   COZAAR PO Take 1 tablet by mouth daily.   escitalopram 20 MG tablet Commonly known as: LEXAPRO Take 20 mg by mouth daily.   FISH OIL PO Take 1 capsule by mouth 2 (two) times daily.   GLUCOSAMINE-CHONDROITIN PO Take 1 tablet by mouth 2 (two) times daily.   HYDROCHLOROTHIAZIDE PO Take 1 tablet by mouth daily.   MAGNESIUM PO Take 1 tablet by mouth daily.   methocarbamol 500 MG tablet Commonly known as: ROBAXIN Take 500 mg by mouth daily as needed for muscle spasms (migraine).   TURMERIC PO Take 1 capsule by mouth daily.      Follow-up Information    FuscoElfredia NevinsMD.   Specialty: Internal Medicine Contact information: [address] [place] [address] [phone]            Time coordinating discharge: 25 min  Signed:  Joseph Art DO  [facility] 05/27/2019, 3:58 PM

## 2019-05-27 NOTE — Progress Notes (Signed)
Bilateral carotid duplex completed. Preliminary results in Chart review CV Proc. [facility], [employer] 05/27/2019, 1:34 PM

## 2019-05-27 NOTE — ED Notes (Addendum)
Pt ambulated to bathroom w/o difficulty - pt being transported to 5C13 via stretcher w/telemetry. Pt did not receive breakfast tray while in ED.

## 2019-05-27 NOTE — Progress Notes (Signed)
  Echocardiogram 2D Echocardiogram has been performed.  Angela Wallace 05/27/2019, 12:38 PM

## 2019-05-27 NOTE — Progress Notes (Signed)
Pt oriented to room, sitting in chair  All belongings in reach including call light  Pt vitals documents, verified telemetry,completed NIH  Pt denies pain Assisted pt in ordering breakfast  Will continue to monitor

## 2019-05-27 NOTE — Progress Notes (Signed)
2D echo at bedside

## 2019-05-27 NOTE — Progress Notes (Signed)
PT Cancellation Note  Patient Details Name: Angela Wallace MRN: [id number] DOB: [birth year]   Cancelled Treatment:    Reason Eval/Treat Not Completed: PT screened, no needs identified, will sign off.  Pt is up walking with independence and will not require PT services.  Reconsult if her condition changes, and thank you for the PT referral.   Ramond Dial 05/27/2019, 2:17 PM   Mee Hives, PT MS Acute Rehab Dept. Number: [place] [facility] [place]

## 2019-06-26 ENCOUNTER — Ambulatory Visit: Payer: PRIVATE HEALTH INSURANCE | Admitting: Adult Health

## 2019-06-26 ENCOUNTER — Encounter: Payer: Self-pay | Admitting: Adult Health

## 2019-06-26 ENCOUNTER — Other Ambulatory Visit: Payer: Self-pay

## 2019-06-26 VITALS — BP 122/69 | HR 65 | Temp 97.5°F | Ht 72.0 in | Wt 225.4 lb

## 2019-06-26 DIAGNOSIS — E785 Hyperlipidemia, unspecified: Secondary | ICD-10-CM

## 2019-06-26 DIAGNOSIS — I10 Essential (primary) hypertension: Secondary | ICD-10-CM

## 2019-06-26 DIAGNOSIS — R29818 Other symptoms and signs involving the nervous system: Secondary | ICD-10-CM

## 2019-06-26 DIAGNOSIS — I639 Cerebral infarction, unspecified: Secondary | ICD-10-CM | POA: Diagnosis not present

## 2019-06-26 DIAGNOSIS — G43111 Migraine with aura, intractable, with status migrainosus: Secondary | ICD-10-CM

## 2019-06-26 DIAGNOSIS — I6381 Other cerebral infarction due to occlusion or stenosis of small artery: Secondary | ICD-10-CM

## 2019-06-26 MED ORDER — ATORVASTATIN CALCIUM 80 MG PO TABS: 80.0000 mg | ORAL_TABLET | Freq: Every day | ORAL | 0 refills | Status: DC

## 2019-06-26 NOTE — Patient Instructions (Signed)
Will speak with Dr. Jaynee Eagles regarding start of therapy - we will call you regarding recommendations   Continue clopidogrel 75 mg daily  and lipitor  for secondary stroke prevention  Continue to follow up with PCP regarding cholesterol and blood pressure management   Continue to monitor blood pressure at home  Maintain strict control of hypertension with blood pressure goal below 130/90, diabetes with hemoglobin A1c goal below 6.5% and cholesterol with LDL cholesterol (bad cholesterol) goal below 70 mg/dL. I also advised the patient to eat a healthy diet with plenty of whole grains, cereals, fruits and vegetables, exercise regularly and maintain ideal body weight.        Thank you for coming to see [place] at [facility]. I hope we have been able to provide you high quality care today.  You may receive a patient satisfaction survey over the next few weeks. We would appreciate your feedback and comments so that we may continue to improve ourselves and the health of our patients.

## 2019-06-26 NOTE — Progress Notes (Signed)
[facility] [address] [place]. [place] [address] [phone]       HOSPITAL FOLLOW UP NOTE  Ms. [name] Date of Birth:  [birth year] Medical Record Number:  [id number]   Reason for Referral:  hospital stroke follow up    CHIEF COMPLAINT:  Chief Complaint  Patient presents with   Hospitalization Follow-up    Alone. Rm 9. She would like discuss what caused her mini stroke.     HPI: Angela [place] being seen today for in office hospital follow-up regarding migraines versus small thalamic stroke on 05/26/2019.  History obtained from patient and chart review. Reviewed all radiology images and labs personally.  Ms. [name] is a 58 y.o. female with history of Htn and migraines  presented with multiple transient episodes of perioral numbness, left-sided face arm and leg numbness that lasted for a few minutes followed by headache.   Stroke work-up completed with presenting symptoms possibly secondary to migraines versus stroke with questionable tiny acute infarction along the lateral right thalamus as evidenced on MRI secondary to small vessel disease.  Carotid Dopplers unremarkable.  2D echo normal EF without cardiac source of embolus identified.  LDL 175.  A1c 5.7.  Recommended DAPT for 3 weeks and Plavix alone as previously on aspirin.  HTN stable.  Initiated atorvastatin 80 mg daily for HLD management.  Other stroke risk factors include advanced age, former tobacco use, EtOH use, obesity and history of migraines.  Discharged home in stable condition without therapy needs.  [name] is being seen today for hospital follow-up.  Residual stroke deficits mild left finger numbness which can worsen with temperature change but overall improving.  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  She does report high stress with her job position and increased stress when her headache started approximately 4 months ago.  History of migraines with  occasional visual auras but has not experienced migraine headache in numerous years.  She was referred to headache wellness clinic and saw Dr. Neale Burly where she received trigger point injections and child Robaxin, baclofen and Fioricet without much benefit.  She had previously been on Topamax and beta-blockers for preventative migraine treatment but difficulty tolerating.  She has trialed abortive therapies in the past but had complications.  She is in the process of initiating Aimovig with Dr. Neale Burly but has not yet started.  Current headaches typically start with bilateral neck tension and then experience pain left temporal and frontal region.  These headaches will be associated with photophobia and phonophobia.  She is planning on undergoing HST tomorrow which was ordered by her PCP to assess for potential sleep apnea.  She has completed 3 weeks DAPT and continues on Plavix alone without bleeding or bruising.  Continues on atorvastatin without myalgias.  Does plan on having follow-up with PCP after HST completed and plans on doing lab work at that time.  Blood pressure today satisfactory 122/69.  No further concerns at this time.     ROS:   14 system review of systems performed and negative with exception of headaches  PMH:  Past Medical History:  Diagnosis Date   Hypertension    Migraines     PSH:  Past Surgical History:  Procedure Laterality Date   ABDOMINAL HYSTERECTOMY     CYST EXCISION      Social History:  Social History   Socioeconomic History   Marital status: Divorced    Spouse name: Not on  file   Number of children: Not on file   Years of education: Not on file   Highest education level: Not on file  Occupational History   Not on file  Social Needs   Financial resource strain: Not on file   Food insecurity    Worry: Not on file    Inability: Not on file   Transportation needs    Medical: Not on file    Non-medical: Not on file  Tobacco Use    Smoking status: Former Smoker    Years: 5.00    Types: Cigarettes   Smokeless tobacco: Never Used  Substance and Sexual Activity   Alcohol use: Yes    Comment: occasional   Drug use: No   Sexual activity: Not on file  Lifestyle   Physical activity    Days per week: Not on file    Minutes per session: Not on file   Stress: Not on file  Relationships   Social connections    Talks on phone: Not on file    Gets together: Not on file    Attends religious service: Not on file    Active member of club or organization: Not on file    Attends meetings of clubs or organizations: Not on file    Relationship status: Not on file   Intimate partner violence    Fear of current or ex partner: Not on file    Emotionally abused: Not on file    Physically abused: Not on file    Forced sexual activity: Not on file  Other Topics Concern   Not on file  Social History Narrative   Not on file    Family History:  Family History  Problem Relation Age of Onset   Heart failure Mother    Heart failure Father    Hypertension Other     Medications:   Current Outpatient Medications on File Prior to Visit  Medication Sig Dispense Refill   atorvastatin (LIPITOR) 80 MG tablet Take 1 tablet (80 mg total) by mouth daily at 6 PM. 30 tablet 0   BACLOFEN PO Take 1 tablet by mouth 3 (three) times daily as needed (for migraine-related tension).      CALCIUM PO Take 1 tablet by mouth daily.     Cholecalciferol (VITAMIN D3 PO) Take 1 tablet by mouth daily.     clopidogrel (PLAVIX) 75 MG tablet Take 1 tablet (75 mg total) by mouth daily. 30 tablet 1   Coenzyme Q10 (COQ10) 200 MG CAPS Take 200 mg by mouth daily.      escitalopram (LEXAPRO) 20 MG tablet Take 20 mg by mouth daily.     GLUCOSAMINE-CHONDROITIN PO Take 1 tablet by mouth 2 (two) times daily.     HYDROCHLOROTHIAZIDE PO Take 1 tablet by mouth daily.     Losartan Potassium (COZAAR PO) Take 1 tablet by mouth daily.      MAGNESIUM PO Take 1 tablet by mouth daily.      Omega-3 Fatty Acids (FISH OIL PO) Take 1 capsule by mouth 2 (two) times daily.     POTASSIUM PO Take 1 tablet by mouth daily.     TURMERIC PO Take 1 capsule by mouth daily.      No current facility-administered medications on file prior to visit.     Allergies:   Allergies  Allergen Reactions   Chocolate Other (See Comments)    Consuming a little more than usual can trigger a migraine   Citrus  Other (See Comments)    Consuming a little more than usual can trigger a migraine   Codeine Other (See Comments)    Headache   Nitrates, Organic Other (See Comments)    Consuming a little more than usual can trigger a migraine   Peanut-Containing Drug Products Other (See Comments)    Consuming a little more than usual can trigger a migraine   Pineapple Other (See Comments)    Consuming a little more than usual can trigger a migraine     Physical Exam  Vitals:   06/26/19 1459  BP: 122/69  Pulse: 65  Temp: (!) 97.5 F (36.4 C)  TempSrc: Oral  Weight: 225 lb 6.4 oz (102.2 kg)  Height: 6' (1.829 m)   Body mass index is 30.57 kg/m. No exam data present  No flowsheet data found.   General: well developed, well nourished, pleasant middle-age Caucasian female, seated, in no evident distress Head: head normocephalic and atraumatic.   Neck: supple with no carotid or supraclavicular bruits Cardiovascular: regular rate and rhythm, no murmurs Musculoskeletal: no deformity Skin:  no rash/petichiae Vascular:  Normal pulses all extremities   Neurologic Exam Mental Status: Awake and fully alert. Oriented to place and time. Recent and remote memory intact. Attention span, concentration and fund of knowledge appropriate. Mood and affect appropriate.  Cranial Nerves: Fundoscopic exam reveals sharp disc margins. Pupils equal, briskly reactive to light. Extraocular movements full without nystagmus. Visual fields full to confrontation.  Hearing intact. Facial sensation intact. Face, tongue, palate moves normally and symmetrically.  Motor: Normal bulk and tone. Normal strength in all tested extremity muscles. Sensory.: intact to touch , pinprick , position and vibratory sensation.  Coordination: Rapid alternating movements normal in all extremities. Finger-to-nose and heel-to-shin performed accurately bilaterally. Gait and Station: Arises from chair without difficulty. Stance is normal. Gait demonstrates normal stride length and balance Reflexes: 1+ and symmetric. Toes downgoing.     NIHSS  0 Modified Rankin  1   Diagnostic Data (Labs, Imaging, Testing)  CT HEAD WO CONTRAST 05/26/19 IMPRESSION: Normal head CT.  MR BRAIN WO CONTRAST 05/26/2019 IMPRESSION: No definite acute finding. Question of a tiny acute infarction along the lateral right thalamus. Minimal small vessel change of the pons and hemispheric white matter.  VAS US CAROTID DUPLEX BILATERAL 05/27/2019 Summary: Right Carotid: There is no evidence of stenosis in the right ICA. Left Carotid: Velocities in the left ICA are consistent with a 1-39% stenosis. Vertebrals:  Bilateral vertebral arteries demonstrate antegrade flow. Subclavians: Normal flow hemodynamics were seen in bilateral subclavian              arteries.  ECHOCARDIOGRAM 05/27/2019 IMPRESSIONS  1. Left ventricular ejection fraction, by visual estimation, is 60 to 65%. The left ventricle has normal function. Left ventricular septal wall thickness was mildly increased. Mildly increased left ventricular posterior wall thickness. There is mildly  increased left ventricular hypertrophy.  2. Elevated mean left atrial pressure.  3. Left ventricular diastolic Doppler parameters are consistent with impaired relaxation pattern of LV diastolic filling.  4. Global right ventricle has normal systolic function.The right ventricular size is normal. No increase in right ventricular wall thickness.  5. Left  atrial size was normal.  6. Right atrial size was normal.  7. The mitral valve is normal in structure. No evidence of mitral valve regurgitation. No evidence of mitral stenosis.  8. The tricuspid valve is normal in structure. Tricuspid valve regurgitation was not visualized by color flow Doppler.  9.  The aortic valve has an indeterminant number of cusps Aortic valve regurgitation was not visualized by color flow Doppler. Structurally normal aortic valve, with no evidence of sclerosis or stenosis. 10. The pulmonic valve was not well visualized. Pulmonic valve regurgitation is not visualized by color flow Doppler. 11. Mildly dilated proximal ascending aorta 3.9 cm.    ASSESSMENT: Angela [place] is a 58 y.o. year old female presented with multiple transient episodes of left-sided numbness followed by a headache on 05/26/19 with stroke work up possible migraines vs possible small lateral right thalamus stroke secondary to small vessel disease. Vascular risk factors include HTN, HLD and migraines.  Residual deficits of mild left fingertip numbness but overall recovering well.  She continues to have headaches frequently which appeared to be more tension related versus migrainous.    PLAN:  1. Possible right thalamic stroke : Continue aspirin 81 mg daily  and lipitor  for secondary stroke prevention. Maintain strict control of hypertension with blood pressure goal below 130/90, diabetes with hemoglobin A1c goal below 6.5% and cholesterol with LDL cholesterol (bad cholesterol) goal below 70 mg/dL.  I also advised the patient to eat a healthy diet with plenty of whole grains, cereals, fruits and vegetables, exercise regularly with at least 30 minutes of continuous activity daily and maintain ideal body weight. 2. Migraines/headaches: Discussed patient with Dr. Lucia Gaskins ([facility] headache specialist) recommended initiating Aimovig and to complete HST.  If HST shows sleep apnea, it will be imperative that this is  managed for secondary stroke prevention and high likelihood of improvement of headaches/migraines.  If HST is negative or no improvement of migraine/headaches after treatment and with initiation of Aimovig, she will be referred to Dr. Lucia Gaskins for other treatment options 3. HTN: Advised to continue current treatment regimen.  Today's BP stable.  Advised to continue to monitor at home along with continued follow-up with PCP for management 4. HLD: Advised to continue current treatment regimen along with continued follow-up with PCP for future prescribing and monitoring of lipid panel 5. Suspected OSA: Complete HST tomorrow night ordered by PCP   Follow-up in 3 months or call earlier if needed   Greater than 50% of time during this 45 minute visit was spent on counseling, explanation of diagnosis of possible right thalamic stroke, continued tension versus migraine headaches, reviewing risk factor management of HTN, HLD and possible OSA, planning of further management along with potential future management, and discussion with patient and family answering all questions.    Ihor Austin, AGNP-BC  [facility] [address] [place], [place] [address]  Phone [phone] Fax [phone] Note: This document was prepared with digital dictation and possible smart phrase technology. Any transcriptional errors that result from this process are unintentional.

## 2019-06-27 NOTE — Progress Notes (Signed)
I agree with the above plan 

## 2019-08-02 ENCOUNTER — Telehealth: Payer: Self-pay | Admitting: Adult Health

## 2019-08-02 NOTE — Telephone Encounter (Signed)
Pt states she has had a headache for several days and no OTC medication is helping. She would like to discuss with provider or RN about possible treatments. Please advise.

## 2019-08-03 NOTE — Telephone Encounter (Signed)
did anybody address this yesterday?  During visit, she was advised to complete home sleep study and to initiate Aimovig as recommended at headache clinic.  If sleep study negative and Aimovig did not provide benefit, Dr. Jaynee Eagles was agreeable to seeing her

## 2019-08-07 NOTE — Telephone Encounter (Signed)
Spoke with the patient that she had a migraine that started the day before thanksgiving that lasted all the way to the following Wednesday. She stated that the only she took was Advil or Ibuprofen. She currently does not have a migraine. She has a slight headache. She also mentioned that she has started Aimovig she is just worried that she is going to get in the spot where she has a migraine every day for 3 months. She stated that she believes her migraines are stress related since she deal with anxiety. She needs a refill on her Aimovig. Please advise.

## 2019-08-07 NOTE — Telephone Encounter (Signed)
She is currently being seen by provider at headache clinic who initiated Aimovig as well as needing to pursue sleep study for potential sleep apnea.  Discussed patient with Dr. Jaynee Eagles who recommended use of Aimovig with ongoing management by provider at wellness clinic and to complete sleep study.  If sleep apnea found, treatment needs to be initiated for potential benefit of symptoms. Dr. Jaynee Eagles was in agreement to see patient if she gets no benefit with Aimovig for sleep apnea treatment if indicated

## 2019-08-07 NOTE — Telephone Encounter (Signed)
Unable to get in contact with the patient. LVM letting the patient know what Janett Billow recommends. Office number provided in case she has any questions or concerns.

## 2019-09-18 ENCOUNTER — Ambulatory Visit: Payer: No Typology Code available for payment source | Admitting: Orthopedic Surgery

## 2019-09-27 ENCOUNTER — Other Ambulatory Visit: Payer: Self-pay

## 2019-09-27 ENCOUNTER — Ambulatory Visit: Payer: PRIVATE HEALTH INSURANCE | Admitting: Adult Health

## 2019-09-27 ENCOUNTER — Encounter: Payer: Self-pay | Admitting: Adult Health

## 2019-09-27 VITALS — BP 142/88 | HR 88 | Temp 97.3°F | Ht 72.0 in | Wt 226.4 lb

## 2019-09-27 DIAGNOSIS — I6381 Other cerebral infarction due to occlusion or stenosis of small artery: Secondary | ICD-10-CM

## 2019-09-27 DIAGNOSIS — I10 Essential (primary) hypertension: Secondary | ICD-10-CM

## 2019-09-27 DIAGNOSIS — G43111 Migraine with aura, intractable, with status migrainosus: Secondary | ICD-10-CM

## 2019-09-27 DIAGNOSIS — I639 Cerebral infarction, unspecified: Secondary | ICD-10-CM | POA: Diagnosis not present

## 2019-09-27 DIAGNOSIS — G4733 Obstructive sleep apnea (adult) (pediatric): Secondary | ICD-10-CM

## 2019-09-27 DIAGNOSIS — E785 Hyperlipidemia, unspecified: Secondary | ICD-10-CM

## 2019-09-27 DIAGNOSIS — Z9989 Dependence on other enabling machines and devices: Secondary | ICD-10-CM

## 2019-09-27 NOTE — Progress Notes (Signed)
[facility] [address] [place] [address] [phone]       HOSPITAL FOLLOW UP NOTE  [name] Date of Birth:  1961-08-14 Medical Record Number:  [id number]   Reason for Referral:  hospital stroke follow up    CHIEF COMPLAINT:  Chief Complaint  Patient presents with  . Thalamic stroke    HPI: Angela Mcelvain Griffithis being seen today for in office hospital follow-up regarding migraines versus small thalamic stroke on 05/26/2019.  History obtained from patient and chart review. Reviewed all radiology images and labs personally.  [name] is a 59 y.o. female with history of Htn and migraines  presented with multiple transient episodes of perioral numbness, left-sided face arm and leg numbness that lasted for a few minutes followed by headache.   Stroke work-up completed with presenting symptoms possibly secondary to migraines versus stroke with questionable tiny acute infarction along the lateral right thalamus as evidenced on MRI secondary to small vessel disease.  Carotid Dopplers unremarkable.  2D echo normal EF without cardiac source of embolus identified.  LDL 175.  A1c 5.7.  Recommended DAPT for 3 weeks and Plavix alone as previously on aspirin.  HTN stable.  Initiated atorvastatin 80 mg daily for HLD management.  Other stroke risk factors include advanced age, former tobacco use, EtOH use, obesity and history of migraines.  Discharged home in stable condition without therapy needs.  Angela Wallace is being seen today for hospital follow-up.  Residual stroke deficits mild left finger numbness which can worsen with temperature change but overall improving.  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  She does report high stress with her job position and increased stress when her headache started approximately 4 months ago.  History of migraines with occasional visual auras but has not experienced migraine headache in numerous  years.  She was referred to headache wellness clinic and saw Dr. Domingo Cocking where she received trigger point injections and trialed Robaxin, baclofen and Fioricet without much benefit.  She had previously been on Topamax and beta-blockers for preventative migraine treatment but difficulty tolerating.  She has trialed abortive therapies in the past but had complications.  She is in the process of initiating Aimovig with Dr. Domingo Cocking but has not yet started.  Current headaches typically start with bilateral neck tension and then experience pain left temporal and frontal region.  These headaches will be associated with photophobia and phonophobia.  She is planning on undergoing HST tomorrow which was ordered by her PCP to assess for potential sleep apnea.  She has completed 3 weeks DAPT and continues on Plavix alone without bleeding or bruising.  Continues on atorvastatin without myalgias.  Does plan on having follow-up with PCP after HST completed and plans on doing lab work at that time.  Blood pressure today satisfactory 122/69.  No further concerns at this time.  Update 09/27/2019: Angela Wallace is a 59 year old female who is being seen today for follow-up.  Residual stroke deficits include very mild left hand middle fingertip numbness but overall improving.  She has since established care with mood treatment center in [place] with great improvement of her overall anxiety and stress.  She believes this is greatly impacted her headache/migraine frequency as she believes she had an actual migraine back in November but has not had any since that time.  She used Aimovig for the month of November but did not continue as she did not have any  additional refills.  She was also found to have sleep apnea and has been using her CPAP machine for the past 3 months.  She continues on Plavix without bleeding or bruising.  Atorvastatin discontinued due to myalgias and placed on Nexletol by PCP.  Blood pressure today 142/88.   Denies new or worsening stroke/TIA symptoms.       ROS:   14 system review of systems performed and negative with exception of numbness  PMH:  Past Medical History:  Diagnosis Date  . Hypertension   . Migraines   . Stroke [facility])     PSH:  Past Surgical History:  Procedure Laterality Date  . ABDOMINAL HYSTERECTOMY    . CYST EXCISION      Social History:  Social History   Socioeconomic History  . Marital status: Divorced    Spouse name: Not on file  . Number of children: Not on file  . Years of education: Not on file  . Highest education level: Not on file  Occupational History  . Not on file  Tobacco Use  . Smoking status: Former Smoker    Years: 5.00    Types: Cigarettes  . Smokeless tobacco: Never Used  Substance and Sexual Activity  . Alcohol use: Yes    Comment: occasional  . Drug use: No  . Sexual activity: Not on file  Other Topics Concern  . Not on file  Social History Narrative  . Not on file   Social Determinants of Health   Financial Resource Strain:   . Difficulty of Paying Living Expenses: Not on file  Food Insecurity:   . Worried About Programme researcher, broadcasting/film/video in the Last Year: Not on file  . Ran Out of Food in the Last Year: Not on file  Transportation Needs:   . Lack of Transportation (Medical): Not on file  . Lack of Transportation (Non-Medical): Not on file  Physical Activity:   . Days of Exercise per Week: Not on file  . Minutes of Exercise per Session: Not on file  Stress:   . Feeling of Stress : Not on file  Social Connections:   . Frequency of Communication with Friends and Family: Not on file  . Frequency of Social Gatherings with Friends and Family: Not on file  . Attends Religious Services: Not on file  . Active Member of Clubs or Organizations: Not on file  . Attends Banker Meetings: Not on file  . Marital Status: Not on file  Intimate Partner Violence:   . Fear of Current or Ex-Partner: Not on file  .  Emotionally Abused: Not on file  . Physically Abused: Not on file  . Sexually Abused: Not on file    Family History:  Family History  Problem Relation Age of Onset  . Heart failure Mother   . Heart failure Father   . Hypertension Other     Medications:   Current Outpatient Medications on File Prior to Visit  Medication Sig Dispense Refill  . Bempedoic Acid (NEXLETOL) 180 MG TABS Take 180 mg by mouth at bedtime.    Marland Kitchen CALCIUM PO Take 1 tablet by mouth daily.    . Cholecalciferol (VITAMIN D3 PO) Take 1 tablet by mouth daily.    . clopidogrel (PLAVIX) 75 MG tablet Take 1 tablet (75 mg total) by mouth daily. 30 tablet 1  . Coenzyme Q10 (COQ10) 200 MG CAPS Take 200 mg by mouth daily.     Marland Kitchen FLUoxetine (PROZAC) 40 MG  capsule Take 40 mg by mouth daily.    Marland Kitchen. gabapentin (NEURONTIN) 300 MG capsule Take 300 mg by mouth 2 (two) times daily.    Marland Kitchen. GLUCOSAMINE-CHONDROITIN PO Take 1 tablet by mouth 2 (two) times daily.    Marland Kitchen. HYDROCHLOROTHIAZIDE PO Take 1 tablet by mouth daily.    Marland Kitchen. LORazepam (ATIVAN) 0.5 MG tablet Take by mouth every 8 (eight) hours. Dose unknown    . Losartan Potassium (COZAAR PO) Take 1 tablet by mouth daily.    Marland Kitchen. MAGNESIUM PO Take 1 tablet by mouth daily.     . Omega-3 Fatty Acids (FISH OIL PO) Take 1 capsule by mouth 2 (two) times daily.    Marland Kitchen. POTASSIUM PO Take 1 tablet by mouth daily.    . predniSONE (DELTASONE) 10 MG tablet 1 tablet by mouth once a day  3 pills po x 3 days, then 2 po x 3 days, then 1 po x 3 days    . TURMERIC PO Take 1 capsule by mouth daily.      No current facility-administered medications on file prior to visit.    Allergies:   Allergies  Allergen Reactions  . Chocolate Other (See Comments)    Consuming a little more than usual can trigger a migraine  . Citrus Other (See Comments)    Consuming a little more than usual can trigger a migraine  . Codeine Other (See Comments)    Headache  . Lipitor [Atorvastatin] Other (See Comments)    Joint pain  .  Nitrates, Organic Other (See Comments)    Consuming a little more than usual can trigger a migraine  . Peanut-Containing Drug Products Other (See Comments)    Consuming a little more than usual can trigger a migraine  . Pineapple Other (See Comments)    Consuming a little more than usual can trigger a migraine     Physical Exam  Vitals:   09/27/19 1551  BP: (!) 142/88  Pulse: 88  Temp: (!) 97.3 F (36.3 C)  Weight: 226 lb 6.4 oz (102.7 kg)  Height: 6' (1.829 m)   Body mass index is 30.71 kg/m. No exam data present  General: well developed, well nourished, pleasant middle-age Caucasian female, seated, in no evident distress Head: head normocephalic and atraumatic.   Neck: supple with no carotid or supraclavicular bruits Cardiovascular: regular rate and rhythm, no murmurs Musculoskeletal: no deformity Skin:  no rash/petichiae Vascular:  Normal pulses all extremities   Neurologic Exam Mental Status: Awake and fully alert.   Normal speech and language.  Oriented to place and time. Recent and remote memory intact. Attention span, concentration and fund of knowledge appropriate. Mood and affect appropriate.  Cranial Nerves: Pupils equal, briskly reactive to light. Extraocular movements full without nystagmus. Visual fields full to confrontation. Hearing intact. Facial sensation intact. Face, tongue, palate moves normally and symmetrically.  Motor: Normal bulk and tone. Normal strength in all tested extremity muscles. Sensory.: intact to touch , pinprick , position and vibratory sensation.  Coordination: Rapid alternating movements normal in all extremities. Finger-to-nose and heel-to-shin performed accurately bilaterally. Gait and Station: Arises from chair without difficulty. Stance is normal. Gait demonstrates normal stride length and balance Reflexes: 1+ and symmetric. Toes downgoing.       Diagnostic Data (Labs, Imaging, Testing)  CT HEAD WO  CONTRAST 05/26/19 IMPRESSION: Normal head CT.  MR BRAIN WO CONTRAST 05/26/2019 IMPRESSION: No definite acute finding. Question of a tiny acute infarction along the lateral right thalamus. Minimal small  vessel change of the pons and hemispheric white matter.  VAS US CAROTID DUPLEX BILATERAL 05/27/2019 Summary: Right Carotid: There is no evidence of stenosis in the right ICA. Left Carotid: Velocities in the left ICA are consistent with a 1-39% stenosis. Vertebrals:  Bilateral vertebral arteries demonstrate antegrade flow. Subclavians: Normal flow hemodynamics were seen in bilateral subclavian              arteries.  ECHOCARDIOGRAM 05/27/2019 IMPRESSIONS  1. Left ventricular ejection fraction, by visual estimation, is 60 to 65%. The left ventricle has normal function. Left ventricular septal wall thickness was mildly increased. Mildly increased left ventricular posterior wall thickness. There is mildly  increased left ventricular hypertrophy.  2. Elevated mean left atrial pressure.  3. Left ventricular diastolic Doppler parameters are consistent with impaired relaxation pattern of LV diastolic filling.  4. Global right ventricle has normal systolic function.The right ventricular size is normal. No increase in right ventricular wall thickness.  5. Left atrial size was normal.  6. Right atrial size was normal.  7. The mitral valve is normal in structure. No evidence of mitral valve regurgitation. No evidence of mitral stenosis.  8. The tricuspid valve is normal in structure. Tricuspid valve regurgitation was not visualized by color flow Doppler.  9. The aortic valve has an indeterminant number of cusps Aortic valve regurgitation was not visualized by color flow Doppler. Structurally normal aortic valve, with no evidence of sclerosis or stenosis. 10. The pulmonic valve was not well visualized. Pulmonic valve regurgitation is not visualized by color flow Doppler. 11. Mildly dilated proximal  ascending aorta 3.9 cm.    ASSESSMENT: Angela Wallace is a 59 y.o. year old female presented with multiple transient episodes of left-sided numbness followed by a headache on 05/26/19 with stroke work up possible migraines vs possible small lateral right thalamus stroke secondary to small vessel disease. Vascular risk factors include HTN, HLD and migraines.  Residual deficits of mild left fingertip numbness but overall recovering well.  She has also been diagnosed with sleep apnea and initiated CPAP approximately 3 months ago.  She has had a scare with mood treatment center in [place] with great benefit regarding anxiety and improvement of headaches.     PLAN:  1. Possible right thalamic stroke : Continue aspirin 81 mg daily  and Nexletol for secondary stroke prevention. Maintain strict control of hypertension with blood pressure goal below 130/90, diabetes with hemoglobin A1c goal below 6.5% and cholesterol with LDL cholesterol (bad cholesterol) goal below 70 mg/dL.  I also advised the patient to eat a healthy diet with plenty of whole grains, cereals, fruits and vegetables, exercise regularly with at least 30 minutes of continuous activity daily and maintain ideal body weight. 2. Migraines/headaches: has not had any additional headaches/migraines over the past 2 months. Greatly improved since managing anxiety and stress along with initiating CPAP 3. HTN: Advised to continue current treatment regimen.  Today's BP stable.  Advised to continue to monitor at home along with continued follow-up with PCP for management 4. HLD: Advised to continue current treatment regimen along with continued follow-up with PCP for future prescribing and monitoring of lipid panel 5. OSA: advised ongoing use of CPAP for management of sleep apnea along with benefit for stroke prevention and ongoing improvement of tension headaches and migraines.    Follow-up in 6 months or call earlier if needed   Greater than  50% of time during this 30 minute visit was spent on counseling, explanation of diagnosis  of possible right thalamic stroke, discussion regarding improvement of tension headaches and migraines they were likely stress and anxiety induced, reviewing risk factor management of HTN, HLD and new diagnosis of OSA, planning of further management along with potential future management, and discussion with patient answered all questions to satisfaction    Ihor Austin, AGNP-BC  [facility] [address] [place], [place] [address]  Phone [phone] Fax [phone] Note: This document was prepared with digital dictation and possible smart phrase technology. Any transcriptional errors that result from this process are unintentional.

## 2019-09-27 NOTE — Patient Instructions (Signed)
Continue clopidogrel 75 mg daily  and Nexletol  for secondary stroke prevention  Continue to follow up with PCP regarding cholesterol and blood pressure management   [facility] to follow with [facility] for ongoing benefit  Please call office if your migraines worsen  Continue to monitor blood pressure at home  Maintain strict control of hypertension with blood pressure goal below 130/90, diabetes with hemoglobin A1c goal below 6.5% and cholesterol with LDL cholesterol (bad cholesterol) goal below 70 mg/dL. I also advised the patient to eat a healthy diet with plenty of whole grains, cereals, fruits and vegetables, exercise regularly and maintain ideal body weight.  Followup in the future with me in 6 months or call earlier if needed       Thank you for coming to see [place] at [facility]. I hope we have been able to provide you high quality care today.  You may receive a patient satisfaction survey over the next few weeks. We would appreciate your feedback and comments so that we may continue to improve ourselves and the health of our patients.

## 2019-09-28 NOTE — Progress Notes (Signed)
I agree with the above plan 

## 2020-03-13 ENCOUNTER — Telehealth: Payer: Self-pay

## 2020-03-13 NOTE — Telephone Encounter (Signed)
Voicemail message, 3:07p:  Patient having tremors and tingling in her hands, a different feeling from the time she had her stroke. She has questions and wonders if she needs to be seen sooner.

## 2020-03-14 NOTE — Telephone Encounter (Signed)
I called pt.  She has had progressively worsening sx of tingling in fingers, hand now arm, comes and goes (last may 1/2 day) then will go away.  Stroke affected her L side.  No numbness, weaknes or other sx.  These worsening sx have been for the last 5 wks.  She related to stress, (father passing and work), but wanted to run by [place].  I moved her appt to 03-18-20 as 6 month RV, but also to assess.  She is aware to seek ED if signs of stroke (numbness, weakness).

## 2020-03-14 NOTE — Telephone Encounter (Signed)
Noted  

## 2020-03-14 NOTE — Telephone Encounter (Signed)
Previously seen for complicated migraine vs small thalamic stroke after presenting with transient episodes of left arm numbness/tingling. As symptoms have been present for over the past 5 weeks, no indication at this time for urgent evaluation in ED and agree with ED evaluation with any acute worsening or new stroke/TIA symptoms. Will further evaluate on 03/18/2020.

## 2020-03-18 ENCOUNTER — Ambulatory Visit: Payer: No Typology Code available for payment source | Admitting: Adult Health

## 2020-03-18 ENCOUNTER — Other Ambulatory Visit: Payer: Self-pay

## 2020-03-18 ENCOUNTER — Encounter: Payer: Self-pay | Admitting: Adult Health

## 2020-03-18 VITALS — BP 149/82 | HR 74 | Ht 72.0 in | Wt 222.0 lb

## 2020-03-18 DIAGNOSIS — E785 Hyperlipidemia, unspecified: Secondary | ICD-10-CM | POA: Diagnosis not present

## 2020-03-18 DIAGNOSIS — I639 Cerebral infarction, unspecified: Secondary | ICD-10-CM | POA: Diagnosis not present

## 2020-03-18 DIAGNOSIS — Z9989 Dependence on other enabling machines and devices: Secondary | ICD-10-CM

## 2020-03-18 DIAGNOSIS — G4733 Obstructive sleep apnea (adult) (pediatric): Secondary | ICD-10-CM

## 2020-03-18 DIAGNOSIS — R200 Anesthesia of skin: Secondary | ICD-10-CM | POA: Diagnosis not present

## 2020-03-18 DIAGNOSIS — I6381 Other cerebral infarction due to occlusion or stenosis of small artery: Secondary | ICD-10-CM

## 2020-03-18 DIAGNOSIS — R29818 Other symptoms and signs involving the nervous system: Secondary | ICD-10-CM | POA: Diagnosis not present

## 2020-03-18 NOTE — Progress Notes (Signed)
[facility] [address] [place]. [place] [address] [phone]       STROKE FOLLOW UP NOTE  [name] Date of Birth:  Dec 24, 1960 Medical Record Number:  [id number]   Reason for Referral: stroke follow up    CHIEF COMPLAINT:  Chief Complaint  Patient presents with  . Follow-up    tx r here for a stroke f/u Pt is having tingling in her hand going up to her elbow.Marland Kitchen    HPI:   Today, 03/17/2020, Angela Wallace is being seen per patient request due to progressively worsening tingling of LUE over the past 5 weeks.  Previously seen for possible right thalamic stroke vs migraine with residual left hand middle fingertip numbness and over the past 5 weeks, now experiencing transient tingling whole hand and forearm.  Symptoms are transient with a vibratory/tingling sensation and can last for a couple of minutes and then subside and typically do not past the elbow.  Denies weakness, numbness, nerve pain, lower extremity symptoms or any other associated symptoms. Denies wrist, elbow, neck or shoulder pain. History of headaches/migraines which have been stable and his only experience 1 migraine over the past 4 months.  Reports increased stressors due to work issues and recent passing of her father which preceded the onset of symptoms.  Continues to follow with psychology/psychiatry with great benefit and ongoing use of fluoxetine and gabapentin but does admit to increased anxiety related to stressors. Remains on all prescribed medications including clopidogrel and Nexletol without side effects.  Has not had recent lab work.  Blood pressure today 149/82.  Endorses compliance of CPAP for sleep apnea management.  Denies recent medication changes.  Returns today for evaluation.   History provided for reference purposes only Update 09/27/2019: Angela Wallace is a 59 year old female who is being seen today for follow-up.  Residual stroke deficits include very mild left hand middle  fingertip numbness but overall improving.  She has since established care with [facility] treatment center in [place] with great improvement of her overall anxiety and stress.  She believes this is greatly impacted her headache/migraine frequency as she believes she had an actual migraine back in November but has not had any since that time.  She used Aimovig for the month of November but did not continue as she did not have any additional refills.  She was also found to have sleep apnea and has been using her CPAP machine for the past 3 months.  She continues on Plavix without bleeding or bruising.  Atorvastatin discontinued due to myalgias and placed on Nexletol by PCP.  Blood pressure today 142/88.  Denies new or worsening stroke/TIA symptoms.  Initial visit 06/26/2019 JM: Angela Wallace is being seen today for hospital follow-up.  Residual stroke deficits mild left finger numbness which can worsen with temperature change but overall improving.  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  She does report high stress with her job position and increased stress when her headache started approximately 4 months ago.  History of migraines with occasional visual auras but has not experienced migraine headache in numerous years.  She was referred to headache wellness clinic and saw Dr. Neale Burly where she received trigger point injections and trialed Robaxin, baclofen and Fioricet without much benefit.  She had previously been on Topamax and beta-blockers for preventative migraine treatment but difficulty tolerating.  She has trialed abortive therapies in the past but had complications.  She is in the process  of initiating Aimovig with Dr. Neale Burly but has not yet started.  Current headaches typically start with bilateral neck tension and then experience pain left temporal and frontal region.  These headaches will be associated with photophobia and phonophobia.  She is planning on undergoing HST tomorrow which  was ordered by her PCP to assess for potential sleep apnea.  She has completed 3 weeks DAPT and continues on Plavix alone without bleeding or bruising.  Continues on atorvastatin without myalgias.  Does plan on having follow-up with PCP after HST completed and plans on doing lab work at that time.  Blood pressure today satisfactory 122/69.  No further concerns at this time.  Stroke admission 05/26/2019: Angela Wallace is a 59 y.o. female with history of Htn and migraines  presented with multiple transient episodes of perioral numbness, left-sided face arm and leg numbness that lasted for a few minutes followed by headache.   Stroke work-up completed with presenting symptoms possibly secondary to migraines versus stroke with questionable tiny acute infarction along the lateral right thalamus as evidenced on MRI secondary to small vessel disease.  Carotid Dopplers unremarkable.  2D echo normal EF without cardiac source of embolus identified.  LDL 175.  A1c 5.7.  Recommended DAPT for 3 weeks and Plavix alone as previously on aspirin.  HTN stable.  Initiated atorvastatin 80 mg daily for HLD management.  Other stroke risk factors include advanced age, former tobacco use, EtOH use, obesity and history of migraines.  Discharged home in stable condition without therapy needs.    ROS:   14 system review of systems performed and negative with exception of tingling, anxiety  PMH:  Past Medical History:  Diagnosis Date  . Hypertension   . Migraines   . Stroke [facility])     PSH:  Past Surgical History:  Procedure Laterality Date  . ABDOMINAL HYSTERECTOMY    . CYST EXCISION      Social History:  Social History   Socioeconomic History  . Marital status: Divorced    Spouse name: Not on file  . Number of children: Not on file  . Years of education: Not on file  . Highest education level: Not on file  Occupational History  . Not on file  Tobacco Use  . Smoking status: Former Smoker    Years:  5.00    Types: Cigarettes  . Smokeless tobacco: Never Used  Substance and Sexual Activity  . Alcohol use: Yes    Comment: occasional  . Drug use: No  . Sexual activity: Not on file  Other Topics Concern  . Not on file  Social History Narrative  . Not on file   Social Determinants of Health   Financial Resource Strain:   . Difficulty of Paying Living Expenses:   Food Insecurity:   . Worried About Programme researcher, broadcasting/film/video in the Last Year:   . Barista in the Last Year:   Transportation Needs:   . Freight forwarder (Medical):   Marland Kitchen Lack of Transportation (Non-Medical):   Physical Activity:   . Days of Exercise per Week:   . Minutes of Exercise per Session:   Stress:   . Feeling of Stress :   Social Connections:   . Frequency of Communication with Friends and Family:   . Frequency of Social Gatherings with Friends and Family:   . Attends Religious Services:   . Active Member of Clubs or Organizations:   . Attends Banker Meetings:   .  Marital Status:   Intimate Partner Violence:   . Fear of Current or Ex-Partner:   . Emotionally Abused:   Marland Kitchen Physically Abused:   . Sexually Abused:     Family History:  Family History  Problem Relation Age of Onset  . Heart failure Mother   . Heart failure Father   . Hypertension Other     Medications:   Current Outpatient Medications on File Prior to Visit  Medication Sig Dispense Refill  . Bempedoic Acid (NEXLETOL) 180 MG TABS Take 180 mg by mouth at bedtime.    Marland Kitchen CALCIUM PO Take 1 tablet by mouth daily.    . Cholecalciferol (VITAMIN D3 PO) Take 1 tablet by mouth daily.    . clopidogrel (PLAVIX) 75 MG tablet Take 1 tablet (75 mg total) by mouth daily. 30 tablet 1  . Coenzyme Q10 (COQ10) 200 MG CAPS Take 200 mg by mouth daily.     Marland Kitchen FLUoxetine (PROZAC) 40 MG capsule Take 40 mg by mouth daily.    Marland Kitchen gabapentin (NEURONTIN) 300 MG capsule Take 300 mg by mouth 2 (two) times daily.    Marland Kitchen GLUCOSAMINE-CHONDROITIN PO  Take 1 tablet by mouth 2 (two) times daily.    Marland Kitchen HYDROCHLOROTHIAZIDE PO Take 1 tablet by mouth daily.    Marland Kitchen LORazepam (ATIVAN) 0.5 MG tablet Take by mouth every 8 (eight) hours. Dose unknown    . Losartan Potassium (COZAAR PO) Take 1 tablet by mouth daily.    Marland Kitchen MAGNESIUM PO Take 1 tablet by mouth daily.     . Omega-3 Fatty Acids (FISH OIL PO) Take 1 capsule by mouth 2 (two) times daily.    Marland Kitchen POTASSIUM PO Take 1 tablet by mouth daily.    . predniSONE (DELTASONE) 10 MG tablet 1 tablet by mouth once a day  3 pills po x 3 days, then 2 po x 3 days, then 1 po x 3 days     No current facility-administered medications on file prior to visit.    Allergies:   Allergies  Allergen Reactions  . Chocolate Other (See Comments)    Consuming a little more than usual can trigger a migraine  . Citrus Other (See Comments)    Consuming a little more than usual can trigger a migraine  . Codeine Other (See Comments)    Headache  . Lipitor [Atorvastatin] Other (See Comments)    Joint pain  . Nitrates, Organic Other (See Comments)    Consuming a little more than usual can trigger a migraine  . Peanut-Containing Drug Products Other (See Comments)    Consuming a little more than usual can trigger a migraine  . Pineapple Other (See Comments)    Consuming a little more than usual can trigger a migraine     Physical Exam  Vitals:   03/18/20 0914  BP: (!) 149/82  Pulse: 74  Weight: 222 lb (100.7 kg)  Height: 6' (1.829 m)   Body mass index is 30.11 kg/m. No exam data present  General: well developed, well nourished, pleasant middle-age Caucasian female, seated, in no evident distress Head: head normocephalic and atraumatic.   Neck: supple with no carotid or supraclavicular bruits Cardiovascular: regular rate and rhythm, no murmurs Musculoskeletal: no deformity Skin:  no rash/petichiae Vascular:  Normal pulses all extremities   Neurologic Exam Mental Status: Awake and fully alert.   Fluent speech  and language. Oriented to place and time. Recent and remote memory intact. Attention span, concentration and fund of knowledge appropriate.  Mood and affect appropriate.  Cranial Nerves: Pupils equal, briskly reactive to light. Extraocular movements full without nystagmus. Visual fields full to confrontation. Hearing intact. Facial sensation intact. Face, tongue, palate moves normally and symmetrically.  Motor: Normal bulk and tone. Normal strength in all tested extremity muscles. Sensory.:  Hypersensation with pinprick LUE from fingertip to elbow compared to RUE.  All extremities intact to light touch and vibratory sensation.  Coordination: Rapid alternating movements normal in all extremities. Finger-to-nose shows LUE ataxia and heel-to-shin performed accurately bilaterally. Gait and Station: Arises from chair without difficulty. Stance is normal. Gait demonstrates normal stride length and balance Reflexes: Slightly decreased LUE reflex; 1+ and symmetric all other extremities. Toes downgoing.        ASSESSMENT/PLAN: Angela KailSandra W Dimaggio is a 59 y.o. year old female is being seen today per patient request due to 5-week onset of transient LUE tingling with hypersensation and ataxia as well as slightly decreased reflex.  History of multiple transient episodes of left-sided numbness followed by a headache on 05/26/19 with stroke work up possible migraines vs possible small lateral right thalamus stroke secondary to small vessel disease. Vascular risk factors include HTN, HLD, migraines and OSA on CPAP.        1. LUE tingling: 5-week onset associated with tingling, hyper sensation, ataxia and decreased reflexes.  ddx subacute stroke vs recrudescence of prior stroke symptoms vs peripheral etiology.  Obtain MRI brain wo contrast to rule out subacute stroke or underlying etiology causing symptoms.  If MRI unremarkable, would recommend pursuing EMG/NCV to rule out peripheral causes.  Discussed possible  recrudescence of prior stroke symptoms in setting of increased stressors.  Symptoms not currently associated with migraines which are currently under good control.  Advised to proceed to ED with any worsening or new stroke/TIA symptoms. 2. Possible right thalamic stroke: Continue aspirin 81 mg daily  and Nexletol for secondary stroke prevention.  Discussed importance of close PCP follow-up for aggressive stroke risk factor management  3. History of migraines/headaches: Currently well managed with adequate management of anxiety/depression currently being managed by psychology/psychiatry. 4. Depression/anxiety: Previously stable with increased stressors.  Discussed importance of managing stress with additional information provided and have her follow-up with psychology/psychiatry 5. HTN: BP goal<130/90.  Continue to follow with PCP for monitoring and management 6. HLD: LDL goal<70.  Prior intolerance to atorvastatin.  Continue on Nexletol (prescribed by PCP) and will obtain lipid panel today to ensure satisfactory management 7. OSA: advised ongoing use of CPAP for management of sleep apnea along with benefit for stroke prevention and ongoing improvement of tension headaches and migraines.    Follow-up in 4 months or call earlier if needed   I spent 40 minutes of face-to-face and non-face-to-face time with patient.  This included previsit chart review, lab review, study review, order entry, electronic health record documentation, patient education regarding new onset symptoms, history of stroke, increased stressors in relationship to increased tremors, importance of managing stroke risk factors and answered all questions to patient satisfaction   Ihor AustinJessica McCue, [facility]itan Medical CenterGNP-BC  [facility] [address] [place], [place] [id number]  Phone [phone] Fax [phone] Note: This document was prepared with digital dictation and possible smart phrase technology. Any  transcriptional errors that result from this process are unintentional.

## 2020-03-18 NOTE — Patient Instructions (Addendum)
Your Plan:  Obtain MRI brain to rule out recent stroke or underlying causes of left arm symptoms  If MRI normal, would recommend pursuing EMG/NCV for further evaluation  Increase stressors can cause reoccurrence of prior stroke symptoms and due to recent onset of increased stress, this could possibly be the cause of your symptoms but important to rule out underlying causes first.  If you experience new or worsening stroke/TIA symptoms, please call 911 immediately for further evaluation  check cholesterol levels today to ensure satisfactory management  Continue Plavix and Nexletol for secondary stroke prevention  Continue to follow psychiatry/psychology for anxiety/depression management      Follow-up in 4 months or call earlier if needed     Thank you for coming to see [place] at [facility]. I hope we have been able to provide you high quality care today.  You may receive a patient satisfaction survey over the next few weeks. We would appreciate your feedback and comments so that we may continue to improve ourselves and the health of our patients.

## 2020-03-19 LAB — LIPID PANEL
Chol/HDL Ratio: 4 ratio (ref 0.0–4.4)
Cholesterol, Total: 145 mg/dL (ref 100–199)
HDL: 36 mg/dL — ABNORMAL LOW (ref >39)
LDL Chol Calc (NIH): 88 mg/dL (ref 0–99)
Triglycerides: 112 mg/dL (ref 0–149)
VLDL Cholesterol Cal: 21 mg/dL (ref 5–40)

## 2020-03-20 ENCOUNTER — Ambulatory Visit
Admission: RE | Admit: 2020-03-20 | Discharge: 2020-03-20 | Disposition: A | Discharge status: Home / Self Care | Payer: No Typology Code available for payment source | Source: Ambulatory Visit | Attending: Adult Health | Admitting: Adult Health

## 2020-03-20 ENCOUNTER — Other Ambulatory Visit: Payer: Self-pay | Admitting: Adult Health

## 2020-03-20 ENCOUNTER — Telehealth: Payer: Self-pay | Admitting: *Deleted

## 2020-03-20 ENCOUNTER — Other Ambulatory Visit: Payer: Self-pay

## 2020-03-20 DIAGNOSIS — R200 Anesthesia of skin: Secondary | ICD-10-CM | POA: Diagnosis not present

## 2020-03-20 DIAGNOSIS — R29818 Other symptoms and signs involving the nervous system: Secondary | ICD-10-CM | POA: Diagnosis not present

## 2020-03-20 MED ORDER — ROSUVASTATIN CALCIUM 20 MG PO TABS: 20.0000 mg | ORAL_TABLET | Freq: Every day | ORAL | 3 refills | Status: DC

## 2020-03-20 NOTE — Telephone Encounter (Signed)
Spoke with patient and advised her of NP's message. Patient verbalized understanding, appreciation.

## 2020-03-20 NOTE — Telephone Encounter (Signed)
She does not need to switch medications but will take Crestor in addition to Nexletol.  Thank you.

## 2020-03-20 NOTE — Telephone Encounter (Signed)
Spoke with patient and informed her that her labs showed that recent cholesterol levels show great improvement compared to prior with LDL of 88. LDL goal<70 for secondary stroke prevention. she had prior intolerance to high-dose atorvastatin and Shanda Bumps recommends initiating Crestor 20 mg daily for further improvement. I advised that Crestor is typically well-tolerated especially compared to atorvastatin. She stated she has been on another cholesterol medication, NEXLETOL since Sept. She stated she tolerates it well, and asked if she should switch meds. I advised will let NP know and call her back. Patient verbalized understanding, appreciation.

## 2020-03-21 ENCOUNTER — Telehealth: Payer: Self-pay | Admitting: *Deleted

## 2020-03-21 NOTE — Telephone Encounter (Signed)
-----   Message from Ihor Austin, NP sent at 03/20/2020  4:56 PM EDT ----- Please advise patient that recent MRI negative for acute abnormality.  Recommend proceeding with EMG/NCV as discussed during visit

## 2020-03-21 NOTE — Telephone Encounter (Signed)
I LMVM for pt on mobile that her MRI results per JM/NP negative for acute abnormalities.  Proceed with [place]/EMG.  She is to call back for questions.

## 2020-03-25 ENCOUNTER — Telehealth: Payer: Self-pay | Admitting: Neurology

## 2020-03-25 ENCOUNTER — Telehealth: Payer: Self-pay

## 2020-03-25 NOTE — Telephone Encounter (Signed)
LM on the VM re: message below.  If it calls back please connect them to myself or Electronics engineer

## 2020-03-25 NOTE — Telephone Encounter (Signed)
Pt called in at 10:11 am and left a voice mail stating she was returning a call for test results from recent MRI. She is asking for a call back.

## 2020-03-25 NOTE — Telephone Encounter (Signed)
-----   Message from Ihor Austin, NP sent at 03/20/2020  9:17 AM EDT ----- Please advise patient that recent cholesterol levels showed great improvement compared to prior with LDL of 88.  LDL goal<70 for secondary stroke prevention.  Prior intolerance to high-dose atorvastatin and recommend initiating Crestor 20 mg daily for further improvement.  Please advise that Crestor is typically well-tolerated especially compared to atorvastatin but to call office or PCP if she experiences any myalgias or other side effects.

## 2020-03-26 ENCOUNTER — Ambulatory Visit: Payer: PRIVATE HEALTH INSURANCE | Admitting: Adult Health

## 2020-03-26 NOTE — Telephone Encounter (Signed)
Pt returned call and LVM at 2:05pm for these test results. Please call back when available.

## 2020-04-25 ENCOUNTER — Ambulatory Visit: Payer: No Typology Code available for payment source | Admitting: Neurology

## 2020-04-25 ENCOUNTER — Ambulatory Visit (INDEPENDENT_AMBULATORY_CARE_PROVIDER_SITE_OTHER): Payer: No Typology Code available for payment source | Admitting: Neurology

## 2020-04-25 DIAGNOSIS — R202 Paresthesia of skin: Secondary | ICD-10-CM

## 2020-04-25 DIAGNOSIS — R200 Anesthesia of skin: Secondary | ICD-10-CM | POA: Diagnosis not present

## 2020-04-25 DIAGNOSIS — Z0289 Encounter for other administrative examinations: Secondary | ICD-10-CM

## 2020-04-25 NOTE — Progress Notes (Signed)
She has neck pain and radicular symptoms she also has radiation into digits 7-8 and uses her elbows a lot. I had a discussion with patient about the normal left EMG/NCS findings. Unfortunately this does not completely rule out Ulnar neuropathy or C7/C8 Cervical Radiculopathy. I discussed both in detail, provided graphics from outside sources and explanation descriptions from EPIC (see AVS). I would treat conservatively, try elbow brace, do not rest elbows on table. Also follow up with orthopaedics for her neck problems, she is being treated already there with PT and following with orthopaedic surgeon and has had an MRI of her cervical spine per patient report.    I spent 15 minutes of face-to-face and non-face-to-face time with patient on the  1. Numbness and tingling in left hand    diagnosis.  This included previsit chart review, lab review, study review, order entry, electronic health record documentation, patient education on the different diagnostic and therapeutic options, counseling and coordination of care, risks and benefits of management, compliance, or risk factor reduction.This does not include time spent on emg/ncs.

## 2020-04-25 NOTE — Patient Instructions (Signed)
This test is normal but Cannot rule out: Ulnar neuropathy C7/C8 radiculopathy         Cubital Tunnel Syndrome (ulnar neuropathy)  Cubital tunnel syndrome is a condition that causes pain and weakness of the forearm and hand. It happens when one of the nerves that runs along the inside of the elbow joint (ulnar nerve) becomes irritated. This condition is usually caused by repeated arm motions that are done during sports or work-related activities. What are the causes? This condition may be caused by:  Increased pressure on the ulnar nerve at the elbow, arm, or forearm. This can result from: ? Irritation caused by repeated elbow bending. ? Poorly healed elbow fractures. ? Tumors in the elbow. These are usually noncancerous (benign). ? Scar tissue that develops in the elbow after an injury. ? Bony growths (spurs) near the ulnar nerve.  Stretching of the nerve due to loose elbow ligaments.  Trauma to the nerve at the elbow. What increases the risk? The following factors may make you more likely to develop this condition:  Doing manual labor that requires frequent bending of the elbow.  Playing sports that include repeated or strenuous throwing motions, such as baseball.  Playing contact sports, such as football or lacrosse.  Not warming up properly before activities.  Having diabetes.  Having an underactive thyroid (hypothyroidism). What are the signs or symptoms? Symptoms of this condition include:  Clumsiness or weakness of the hand.  Tenderness of the inner elbow.  Aching or soreness of the inner elbow, forearm, or fingers, especially the little finger or the ring finger.  Increased pain when forcing the elbow to bend.  Reduced control when throwing objects.  Tingling, numbness, or a burning feeling inside the forearm or in part of the hand or fingers, especially the little finger or the ring finger.  Sharp pains that shoot from the elbow down to the wrist and  hand.  The inability to grip or pinch hard. How is this diagnosed? This condition is diagnosed based on:  Your symptoms and medical history. Your health care provider will also ask for details about any injury.  A physical exam. You may also have tests, including:  Electromyogram (EMG). This test measures electrical signals sent by your nerves into the muscles.  Nerve conduction study. This test measures how well electrical signals pass through your nerves.  Imaging tests, such as X-rays, ultrasound, and MRI. These tests check for possible causes of your condition. How is this treated? This condition may be treated by:  Stopping the activities that are causing your symptoms to get worse.  Icing and taking medicines to reduce pain and swelling.  Wearing a splint to prevent your elbow from bending, or wearing an elbow pad where the ulnar nerve is closest to the skin.  Working with a physical therapist in less severe cases. This may help to: ? Decrease your symptoms. ? Improve the strength and range of motion of your elbow, forearm, and hand. If these treatments do not help, surgery may be needed. Follow these instructions at home: If you have a splint:  Wear the splint as told by your health care provider. Remove it only as told by your health care provider.  Loosen the splint if your fingers tingle, become numb, or turn cold and blue.  Keep the splint clean.  If the splint is not waterproof: ? Do not let it get wet. ? Cover it with a watertight covering when you take a bath or shower.  Managing pain, stiffness, and swelling   If directed, put ice on the injured area: ? Put ice in a plastic bag. ? Place a towel between your skin and the bag. ? Leave the ice on for 20 minutes, 2-3 times a day.  Move your fingers often to avoid stiffness and to lessen swelling.  Raise (elevate) the injured area above the level of your heart while you are sitting or lying down. General  instructions  Take over-the-counter and prescription medicines only as told by your health care provider.  Do any exercise or physical therapy as told by your health care provider.  Do not drive or use heavy machinery while taking prescription pain medicine.  If you were given an elbow pad, wear it as told by your health care provider.  Keep all follow-up visits as told by your health care provider. This is important. Contact a health care provider if:  Your symptoms get worse.  Your symptoms do not get better with treatment.  You have new pain.  Your hand on the injured side feels numb or cold. Summary  Cubital tunnel syndrome is a condition that causes pain and weakness of the forearm and hand.  You are more likely to develop this condition if you do work or play sports that involve repeated arm movements.  This condition is often treated by stopping repetitive activities, applying ice, and using anti-inflammatory medicines.  In rare cases, surgery may be needed. This information is not intended to replace advice given to you by your health care provider. Make sure you discuss any questions you have with your health care provider. Document Revised: 01/03/2018 Document Reviewed: 01/03/2018 Elsevier Patient Education  2020 [employer]   Cervical Radiculopathy  Cervical radiculopathy means that a nerve in the neck (a cervical nerve) is pinched or bruised. This can happen because of an injury to the cervical spine (vertebrae) in the neck, or as a normal part of getting older. This can cause pain or loss of feeling (numbness) that runs from your neck all the way down to your arm and fingers. Often, this condition gets better with rest. Treatment may be needed if the condition does not get better. What are the causes?  A neck injury.  A bulging disk in your spine.  Muscle movements that you cannot control (muscle spasms).  Tight muscles in your neck due to  overuse.  Arthritis.  Breakdown in the bones and joints of the spine (spondylosis) due to getting older.  Bone spurs that form near the nerves in the neck. What are the signs or symptoms?  Pain. The pain may: ? Run from the neck to the arm and hand. ? Be very bad or irritating. ? Be worse when you move your neck.  Loss of feeling or tingling in your arm or hand.  Weakness in your arm or hand, in very bad cases. How is this treated? In many cases, treatment is not needed for this condition. With rest, the condition often gets better over time. If treatment is needed, options may include:  Wearing a soft neck collar (cervical collar) for short periods of time, as told by your doctor.  Doing exercises (physical therapy) to strengthen your neck muscles.  Taking medicines.  Having shots (injections) in your spine, in very bad cases.  Having surgery. This may be needed if other treatments do not help. The type of surgery that is used depends on the cause of your condition. Follow these instructions at home:  If you have a soft neck collar:  Wear it as told by your doctor. Remove it only as told by your doctor.  Ask your doctor if you can remove the collar for cleaning and bathing. If you are allowed to remove the collar for cleaning or bathing: ? Follow instructions from your doctor about how to remove the collar safely. ? Clean the collar by wiping it with mild soap and water and drying it completely. ? Take out any removable pads in the collar every 1-2 days. Wash them by hand with soap and water. Let them air-dry completely before you put them back in the collar. ? Check your skin under the collar for redness or sores. If you see any, tell your doctor. Managing pain      Take over-the-counter and prescription medicines only as told by your doctor.  If told, put ice on the painful area. ? If you have a soft neck collar, remove it as told by your doctor. ? Put ice in a  plastic bag. ? Place a towel between your skin and the bag. ? Leave the ice on for 20 minutes, 2-3 times a day.  If using ice does not help, you can try using heat. Use the heat source that your doctor recommends, such as a moist heat pack or a heating pad. ? Place a towel between your skin and the heat source. ? Leave the heat on for 20-30 minutes. ? Remove the heat if your skin turns bright red. This is very important if you are unable to feel pain, heat, or cold. You may have a greater risk of getting burned.  You may try a gentle neck and shoulder rub (massage). Activity  Rest as needed.  Return to your normal activities as told by your doctor. Ask your doctor what activities are safe for you.  Do exercises as told by your doctor or physical therapist.  Do not lift anything that is heavier than 10 lb (4.5 kg) until your doctor tells you that it is safe. General instructions  Use a flat pillow when you sleep.  Do not drive while wearing a soft neck collar. If you do not have a soft neck collar, ask your doctor if it is safe to drive while your neck heals.  Ask your doctor if the medicine prescribed to you requires you to avoid driving or using heavy machinery.  Do not use any products that contain nicotine or tobacco, such as cigarettes, e-cigarettes, and chewing tobacco. These can delay healing. If you need help quitting, ask your doctor.  Keep all follow-up visits as told by your doctor. This is important. Contact a doctor if:  Your condition does not get better with treatment. Get help right away if:  Your pain gets worse and is not helped with medicine.  You lose feeling or feel weak in your hand, arm, face, or leg.  You have a high fever.  You have a stiff neck.  You cannot control when you poop or pee (have incontinence).  You have trouble with walking, balance, or talking. Summary  Cervical radiculopathy means that a nerve in the neck is pinched or  bruised.  A nerve can get pinched from a bulging disk, arthritis, an injury to the neck, or other causes.  Symptoms include pain, tingling, or loss of feeling that goes from the neck into the arm or hand.  Weakness in your arm or hand can happen in very bad cases.  Treatment may  include resting, wearing a soft neck collar, and doing exercises. You might need to take medicines for pain. In very bad cases, shots or surgery may be needed. This information is not intended to replace advice given to you by your health care provider. Make sure you discuss any questions you have with your health care provider. Document Revised: 07/08/2018 Document Reviewed: 07/08/2018 Elsevier Patient Education  2020 [employer].

## 2020-04-27 NOTE — Progress Notes (Signed)
    Full Name: Angela Wallace Gender: Female MRN #: [id number] Date of Birth: 02/09/1961    Visit Date: 04/25/2020 08:45 Age: 59 Years Examining Physician: Eren Puebla, MD  Referring Physician: Jessica McCue, NP Height: 6 feet 0 inch  History: Patient with left arm and hand paresthesias  Summary: EMG/NCS was performed on the left upper extremity. All nerves and muscles (as indicated in the following tables) were within normal limits.       Conclusion: This is a normal study of the left upper extremity     ------------------------------- Wiatt Mahabir, M.D.  [facility] [address], [address] [place], [place] [address] Tel: [phone] Fax: [phone]  Verbal informed consent was obtained from the patient, patient was informed of potential risk of procedure, including bruising, bleeding, hematoma formation, infection, muscle weakness, muscle pain, numbness, among others.        MNC    Nerve / Sites Muscle Latency Ref. Amplitude Ref. Rel Amp Segments Distance Velocity Ref. Area    ms ms mV mV %  cm m/s m/s mVms  L Median - APB     Wrist APB 3.3 ?4.4 5.4 ?4.0 100 Wrist - APB 7   25.2     Upper arm APB 7.6  4.9  92.2 Upper arm - Wrist 24 56 ?49 22.1  L Ulnar - ADM     Wrist ADM 2.4 ?3.3 7.4 ?6.0 100 Wrist - ADM 7   30.1     B.Elbow ADM 5.6  6.9  93.6 B.Elbow - Wrist 20 62 ?49 29.9     A.Elbow ADM 7.3  7.0  101 A.Elbow - B.Elbow 10 61 ?49 29.8  L Ulnar - FDI     Wrist FDI 3.5 ?4.5 8.1 ?7.0 100 Wrist - FDI 8   24.4     B.Elbow FDI 7.3  8.1  100 B.Elbow - Wrist 22 57 ?49 21.8     A.Elbow FDI 9.0  8.6  105 A.Elbow - B.Elbow 10 58 ?49 23.0     ADM FDI 2.8  9.2  108 ADM - FDI    23.9         A.Elbow - Wrist               SNC    Nerve / Sites Rec. Site Peak Lat Ref.  Amp Ref. Segments Distance Peak Diff Ref.    ms ms V V  cm ms ms  L Median, Ulnar - Transcarpal comparison     Median Palm Wrist 2.0 ?2.2 50 ?35 Median Palm - Wrist 8       Ulnar Palm  Wrist 2.0 ?2.2 14 ?12 Ulnar Palm - Wrist 8          Median Palm - Ulnar Palm  0.0 ?0.4  L Median - Orthodromic (Dig II, Mid palm)     Dig II Wrist 2.9 ?3.4 31 ?10 Dig II - Wrist 13    L Ulnar - Orthodromic, (Dig V, Mid palm)     Dig V Wrist 2.6 ?3.1 10 ?5 Dig V - Wrist 11             F  Wave    Nerve F Lat Ref.   ms ms  L Ulnar - ADM 28.4 ?32.0       EMG Summary Table    Spontaneous MUAP Recruitment  Muscle IA Fib PSW Fasc Other Amp Dur. Poly Pattern  L. Deltoid Normal None None None _______ Normal   Normal Normal Normal  L. Triceps brachii Normal None None None _______ Normal Normal Normal Normal  L. Cervical paraspinals (low) Normal None None None _______ Normal Normal Normal Normal  L. Flexor digitorum profundus (Ulnar) Normal None None None _______ Normal Normal Normal Normal  L. First dorsal interosseous Normal None None None _______ Normal Normal Normal Normal  L. Opponens pollicis Normal None None None _______ Normal Normal Normal Normal  L. Pronator teres Normal None None None _______ Normal Normal Normal Normal      

## 2020-04-27 NOTE — Procedures (Signed)
Full Name: Angela Wallace Gender: Female MRN #: [id number] Date of Birth: May 31, 1961    Visit Date: 04/25/2020 08:45 Age: 59 Years Examining Physician: Naomie Dean, MD  Referring Physician: Ihor Austin, NP Height: 6 feet 0 inch  History: Patient with left arm and hand paresthesias  Summary: EMG/NCS was performed on the left upper extremity. All nerves and muscles (as indicated in the following tables) were within normal limits.       Conclusion: This is a normal study of the left upper extremity     ------------------------------- Naomie Dean, M.D.  [facility] [address] [place], [place] [address] Tel: [phone] Fax: [phone]  Verbal informed consent was obtained from the patient, patient was informed of potential risk of procedure, including bruising, bleeding, hematoma formation, infection, muscle weakness, muscle pain, numbness, among others.        MNC    Nerve / Sites Muscle Latency Ref. Amplitude Ref. Rel Amp Segments Distance Velocity Ref. Area    ms ms mV mV %  cm m/s m/s mVms  L Median - APB     Wrist APB 3.3 ?4.4 5.4 ?4.0 100 Wrist - APB 7   25.2     Upper arm APB 7.6  4.9  92.2 Upper arm - Wrist 24 56 ?49 22.1  L Ulnar - ADM     Wrist ADM 2.4 ?3.3 7.4 ?6.0 100 Wrist - ADM 7   30.1     B.Elbow ADM 5.6  6.9  93.6 B.Elbow - Wrist 20 62 ?49 29.9     A.Elbow ADM 7.3  7.0  101 A.Elbow - B.Elbow 10 61 ?49 29.8  L Ulnar - FDI     Wrist FDI 3.5 ?4.5 8.1 ?7.0 100 Wrist - FDI 8   24.4     B.Elbow FDI 7.3  8.1  100 B.Elbow - Wrist 22 57 ?49 21.8     A.Elbow FDI 9.0  8.6  105 A.Elbow - B.Elbow 10 58 ?49 23.0     ADM FDI 2.8  9.2  108 ADM - FDI    23.9         A.Elbow - Wrist               SNC    Nerve / Sites Rec. Site Peak Lat Ref.  Amp Ref. Segments Distance Peak Diff Ref.    ms ms V V  cm ms ms  L Median, Ulnar - Transcarpal comparison     Median Palm Wrist 2.0 ?2.2 50 ?35 Median Palm - Wrist 8       Ulnar Palm  Wrist 2.0 ?2.2 14 ?12 Ulnar Palm - Wrist 8          Median Palm - Ulnar Palm  0.0 ?0.4  L Median - Orthodromic (Dig II, Mid palm)     Dig II Wrist 2.9 ?3.4 31 ?10 Dig II - Wrist 13    L Ulnar - Orthodromic, (Dig V, Mid palm)     Dig V Wrist 2.6 ?3.1 10 ?5 Dig V - Wrist 81             F  Wave    Nerve F Lat Ref.   ms ms  L Ulnar - ADM 28.4 ?32.0       EMG Summary Table    Spontaneous MUAP Recruitment  Muscle IA Fib PSW Fasc Other Amp Dur. Poly Pattern  L. Deltoid Normal None None None _______ Normal  Normal Normal Normal  L. Triceps brachii Normal None None None _______ Normal Normal Normal Normal  L. Cervical paraspinals (low) Normal None None None _______ Normal Normal Normal Normal  L. Flexor digitorum profundus (Ulnar) Normal None None None _______ Normal Normal Normal Normal  L. First dorsal interosseous Normal None None None _______ Normal Normal Normal Normal  L. Opponens pollicis Normal None None None _______ Normal Normal Normal Normal  L. Pronator teres Normal None None None _______ Normal Normal Normal Normal

## 2020-04-27 NOTE — Progress Notes (Signed)
See procedure note.

## 2020-05-02 ENCOUNTER — Encounter (HOSPITAL_COMMUNITY): Payer: Self-pay | Admitting: *Deleted

## 2020-05-02 ENCOUNTER — Other Ambulatory Visit: Payer: Self-pay

## 2020-05-02 ENCOUNTER — Emergency Department (HOSPITAL_COMMUNITY)
Admission: EM | Admit: 2020-05-02 | Discharge: 2020-05-02 | Disposition: A | Discharge status: Home / Self Care | Payer: PRIVATE HEALTH INSURANCE | Attending: Emergency Medicine | Admitting: Emergency Medicine

## 2020-05-02 ENCOUNTER — Emergency Department (HOSPITAL_COMMUNITY): Payer: PRIVATE HEALTH INSURANCE

## 2020-05-02 DIAGNOSIS — Z79899 Other long term (current) drug therapy: Secondary | ICD-10-CM | POA: Insufficient documentation

## 2020-05-02 DIAGNOSIS — Y939 Activity, unspecified: Secondary | ICD-10-CM | POA: Diagnosis not present

## 2020-05-02 DIAGNOSIS — Y999 Unspecified external cause status: Secondary | ICD-10-CM | POA: Diagnosis not present

## 2020-05-02 DIAGNOSIS — Z91010 Allergy to peanuts: Secondary | ICD-10-CM | POA: Diagnosis not present

## 2020-05-02 DIAGNOSIS — Z7902 Long term (current) use of antithrombotics/antiplatelets: Secondary | ICD-10-CM | POA: Insufficient documentation

## 2020-05-02 DIAGNOSIS — I10 Essential (primary) hypertension: Secondary | ICD-10-CM | POA: Diagnosis not present

## 2020-05-02 DIAGNOSIS — S0083XA Contusion of other part of head, initial encounter: Secondary | ICD-10-CM | POA: Diagnosis not present

## 2020-05-02 DIAGNOSIS — W06XXXA Fall from bed, initial encounter: Secondary | ICD-10-CM | POA: Insufficient documentation

## 2020-05-02 DIAGNOSIS — Z8673 Personal history of transient ischemic attack (TIA), and cerebral infarction without residual deficits: Secondary | ICD-10-CM | POA: Insufficient documentation

## 2020-05-02 DIAGNOSIS — Y929 Unspecified place or not applicable: Secondary | ICD-10-CM | POA: Insufficient documentation

## 2020-05-02 DIAGNOSIS — S0990XA Unspecified injury of head, initial encounter: Secondary | ICD-10-CM

## 2020-05-02 DIAGNOSIS — Z87891 Personal history of nicotine dependence: Secondary | ICD-10-CM | POA: Diagnosis not present

## 2020-05-02 NOTE — ED Provider Notes (Signed)
[facility] EMERGENCY DEPARTMENT Provider Note   CSN: [id number] Arrival date & time: 05/02/20  1626     History Chief Complaint  Patient presents with  . Head Injury    Angela Wallace is a 59 y.o. female.  Pt fell and hit her head on Monday.  Pt reports she has migraine and has had a headache since.  Pt is on plavix.  Pt called her Md who advised her to come to the hospital for evaluation and ct scan.  The history is provided by the patient. No language interpreter was used.  Head Injury Location:  Frontal Time since incident:  4 days Mechanism of injury: fall   Fall:    Fall occurred:  From a bed   Entrapped after fall: no   Pain details:    Quality:  Aching   Radiates to:  Face   Severity:  Moderate   Progression:  Worsening Chronicity:  New Relieved by:  Nothing Worsened by:  Nothing Ineffective treatments:  None tried Associated symptoms: headache        Past Medical History:  Diagnosis Date  . Hypertension   . Migraines   . Stroke [facility])     Patient Active Problem List   Diagnosis Date Noted  . Acute CVA (cerebrovascular accident) (HCC) 05/27/2019  . Hypokalemia 05/27/2019  . LBBB (left bundle branch block) 05/27/2019  . HTN (hypertension) 05/27/2019  . Depression 05/27/2019    Past Surgical History:  Procedure Laterality Date  . ABDOMINAL HYSTERECTOMY    . CYST EXCISION       OB History    Gravida  3   Para  2   Term  2   Preterm      AB  1   Living  2     SAB  1   TAB      Ectopic      Multiple      Live Births              Family History  Problem Relation Age of Onset  . Heart failure Mother   . Heart failure Father   . Hypertension Other     Social History   Tobacco Use  . Smoking status: Former Smoker    Years: 5.00    Types: Cigarettes  . Smokeless tobacco: Never Used  Substance Use Topics  . Alcohol use: Yes    Comment: occasional  . Drug use: No    Home Medications Prior to Admission  medications   Medication Sig Start Date End Date Taking? Authorizing Provider  Bempedoic Acid (NEXLETOL) 180 MG TABS Take 180 mg by mouth at bedtime.    [provider]  CALCIUM PO Take 1 tablet by mouth daily.    [provider]  Cholecalciferol (VITAMIN D3 PO) Take 1 tablet by mouth daily.    [provider]  clopidogrel (PLAVIX) 75 MG tablet Take 1 tablet (75 mg total) by mouth daily. 05/27/19 05/26/20  Joseph Art, DO  Coenzyme Q10 (COQ10) 200 MG CAPS Take 200 mg by mouth daily.     [provider]  FLUoxetine (PROZAC) 40 MG capsule Take 40 mg by mouth daily.    [provider]  gabapentin (NEURONTIN) 300 MG capsule Take 300 mg by mouth 2 (two) times daily.    [provider]  GLUCOSAMINE-CHONDROITIN PO Take 1 tablet by mouth 2 (two) times daily.    [provider]  HYDROCHLOROTHIAZIDE PO Take 1  tablet by mouth daily.    [provider]  LORazepam (ATIVAN) 0.5 MG tablet Take by mouth every 8 (eight) hours. Dose unknown    [provider]  Losartan Potassium (COZAAR PO) Take 1 tablet by mouth daily.    [provider]  MAGNESIUM PO Take 1 tablet by mouth daily.     [provider]  Omega-3 Fatty Acids (FISH OIL PO) Take 1 capsule by mouth 2 (two) times daily.    [provider]  POTASSIUM PO Take 1 tablet by mouth daily.    [provider]  predniSONE (DELTASONE) 10 MG tablet 1 tablet by mouth once a day  3 pills po x 3 days, then 2 po x 3 days, then 1 po x 3 days 09/25/19   [provider]  rosuvastatin (CRESTOR) 20 MG tablet Take 1 tablet (20 mg total) by mouth daily. 03/20/20   Ihor Austin, NP    Allergies    Chocolate; Citrus; Codeine; Lipitor [atorvastatin]; Nitrates, organic; Peanut-containing drug products; and Pineapple  Review of Systems   Review of Systems  Neurological: Positive for headaches.  All other systems reviewed and are  negative.   Physical Exam Updated Vital Signs BP (!) 101/92   Pulse 92   Temp 98.4 F (36.9 C)   Resp 20   Ht 5' 11.5" (1.816 m)   Wt 100.7 kg   SpO2 100%   BMI 30.53 kg/m   Physical Exam Vitals and nursing note reviewed.  Constitutional:      Appearance: She is well-developed.  HENT:     Head: Normocephalic.     Mouth/Throat:     Mouth: Mucous membranes are moist.  Eyes:     Extraocular Movements: Extraocular movements intact.     Pupils: Pupils are equal, round, and reactive to light.     Comments: Bruising forehead and below both eyes   Cardiovascular:     Rate and Rhythm: Normal rate.  Pulmonary:     Effort: Pulmonary effort is normal.  Abdominal:     General: There is no distension.  Musculoskeletal:        General: Normal range of motion.     Cervical back: Normal range of motion.  Skin:    General: Skin is warm.  Neurological:     Mental Status: She is alert and oriented to person, place, and time.  Psychiatric:        Mood and Affect: Mood normal.     ED Results / Procedures / Treatments   Labs (all labs ordered are listed, but only abnormal results are displayed) Labs Reviewed - No data to display  EKG None  Radiology CT Head Wo Contrast  Result Date: 05/02/2020 CLINICAL DATA:  Head trauma, left forehead bruising, periorbital bruising EXAM: CT HEAD WITHOUT CONTRAST TECHNIQUE: Contiguous axial images were obtained from the base of the skull through the vertex without intravenous contrast. COMPARISON:  MRI 03/20/2020 FINDINGS: Brain: Normal anatomic configuration. No abnormal intra or extra-axial mass lesion or fluid collection. No abnormal mass effect or midline shift. No evidence of acute intracranial hemorrhage or infarct. Ventricular size is normal. Cerebellum unremarkable. Vascular: Unremarkable Skull: Intact Sinuses/Orbits: Paranasal sinuses are clear. Orbits are unremarkable. Other: Mastoid air cells and middle ear cavities are clear. There is  mild subcutaneous edema superficial to the left supraorbital frontal bone. IMPRESSION: 1. No evidence of acute intracranial injury. 2. Mild subcutaneous edema superficial to the left supraorbital frontal bone. Electronically Signed   By:  Helyn Numbers MD   On: 05/02/2020 18:21    Procedures Procedures (including critical care time)  Medications Ordered in ED Medications - No data to display  ED Course  I have reviewed the triage vital signs and the nursing notes.  Pertinent labs & imaging results that were available during my care of the patient were reviewed by me and considered in my medical decision making (see chart for details).    MDM Rules/Calculators/A&P                          MDM:  Ct head no fracture,  Pt counseled on results Final Clinical Impression(s) / ED Diagnoses Final diagnoses:  Minor head injury, initial encounter  Contusion of face, initial encounter    Rx / DC Orders ED Discharge Orders    None    An After Visit Summary was printed and given to the patient.    Elson Areas, [place] 05/02/20 Carman Ching, MD 05/03/20 [phone]

## 2020-05-02 NOTE — Discharge Instructions (Signed)
Return if any problems.

## 2020-05-02 NOTE — ED Triage Notes (Signed)
Sent by her doctor for ct scan, hit head on night stand, is on blood thinner

## 2020-07-04 ENCOUNTER — Ambulatory Visit (INDEPENDENT_AMBULATORY_CARE_PROVIDER_SITE_OTHER): Payer: No Typology Code available for payment source | Admitting: Adult Health

## 2020-07-04 ENCOUNTER — Other Ambulatory Visit: Payer: Self-pay

## 2020-07-04 ENCOUNTER — Encounter: Payer: Self-pay | Admitting: Adult Health

## 2020-07-04 VITALS — BP 144/77 | HR 63 | Ht 71.0 in | Wt 224.0 lb

## 2020-07-04 DIAGNOSIS — M542 Cervicalgia: Secondary | ICD-10-CM | POA: Diagnosis not present

## 2020-07-04 DIAGNOSIS — Z9989 Dependence on other enabling machines and devices: Secondary | ICD-10-CM

## 2020-07-04 DIAGNOSIS — I10 Essential (primary) hypertension: Secondary | ICD-10-CM | POA: Diagnosis not present

## 2020-07-04 DIAGNOSIS — I639 Cerebral infarction, unspecified: Secondary | ICD-10-CM

## 2020-07-04 DIAGNOSIS — G43109 Migraine with aura, not intractable, without status migrainosus: Secondary | ICD-10-CM | POA: Diagnosis not present

## 2020-07-04 DIAGNOSIS — G4733 Obstructive sleep apnea (adult) (pediatric): Secondary | ICD-10-CM

## 2020-07-04 DIAGNOSIS — E785 Hyperlipidemia, unspecified: Secondary | ICD-10-CM

## 2020-07-04 DIAGNOSIS — G8929 Other chronic pain: Secondary | ICD-10-CM

## 2020-07-04 DIAGNOSIS — I6381 Other cerebral infarction due to occlusion or stenosis of small artery: Secondary | ICD-10-CM

## 2020-07-04 MED ORDER — NURTEC 75 MG PO TBDP: 75.0000 mg | ORAL_TABLET | Freq: Every day | ORAL | 0 refills | Status: DC | PRN

## 2020-07-04 NOTE — Progress Notes (Signed)
I agree with the above plan 

## 2020-07-04 NOTE — Patient Instructions (Addendum)
Recommend trial of Nurtec for emergent migraine management. Max dose 1 tablet per day If beneficial, please call office so an official prescription can be sent  Continue clopidogrel 75 mg daily  and Crestor  for secondary stroke prevention  Continue to follow up with PCP regarding cholesterol and blood pressure management  Maintain strict control of hypertension with blood pressure goal below 130/90 and cholesterol with LDL cholesterol (bad cholesterol) goal below 70 mg/dL.     Followup in the future with me in 6 months or call earlier if needed      Thank you for coming to see [place] at [facility]. I hope we have been able to provide you high quality care today.  You may receive a patient satisfaction survey over the next few weeks. We would appreciate your feedback and comments so that we may continue to improve ourselves and the health of our patients.

## 2020-07-04 NOTE — Progress Notes (Signed)
[facility] [address] [place]. [place] [address] [phone]       STROKE FOLLOW UP NOTE  Ms. [name] Date of Birth:  26-Feb-1961 Medical Record Number:  [id number]   Reason for visit: Stroke and chronic migraine    CHIEF COMPLAINT:  Chief Complaint  Patient presents with  . Follow-up    rm 9  . Cerebrovascular Accident    pt here for a f/u on on hand tingling and its better    HPI:   Today, 07/04/2020, Ms. Voisin returns for routine follow-up.  Stable since prior visit without new or worsening stroke/TIA symptoms.  She reports resolution of worsening left arm paresthesias since prior visit.  Underwent MRI which did not show new abnormality and EMG/NCV unremarkable.  She does have chronic cervical pain which has been relatively stable.  She will do dry needling as needed with great benefit.  Reports chronic migraines relatively stable experiencing approximately 3-4 migraine days per month.  Typically either left or right temporal pulsating pain associated with photophobia and phonophobia but denies recent nausea/vomiting.  She continues to follow with mood treatment center in [place], [place] for generalized anxiety with great benefit as well as improvement of migraines.  Recently they increased dose of gabapentin currently on 600 mg twice daily with improvement of cervical neck pain and migraines.  Also remains on fluoxetine and lorazepam as needed for increased anxiety or migraine abortive. Remains on Plavix without bleeding or bruising.  Remains on Nexletol as well as Crestor which was initiated after prior visit for elevated LDL which she has remained on tolerating well without side effects.  Blood pressure today 144/77.  Monitors at home which has been stable.  Reports ongoing compliance with CPAP for OSA management.  No further concerns at this time.    History provided for reference purposes only Update 03/17/2020 JM: Ms. Lightsey is being seen  per patient request due to progressively worsening tingling of LUE over the past 5 weeks.  Previously seen for possible right thalamic stroke vs migraine with residual left hand middle fingertip numbness and over the past 5 weeks, now experiencing transient tingling whole hand and forearm.  Symptoms are transient with a vibratory/tingling sensation and can last for a couple of minutes and then subside and typically do not past the elbow.  Denies weakness, numbness, nerve pain, lower extremity symptoms or any other associated symptoms. Denies wrist, elbow, neck or shoulder pain. History of headaches/migraines which have been stable and his only experience 1 migraine over the past 4 months.  Reports increased stressors due to work issues and recent passing of her father which preceded the onset of symptoms.  Continues to follow with psychology/psychiatry with great benefit and ongoing use of fluoxetine and gabapentin but does admit to increased anxiety related to stressors. Remains on all prescribed medications including clopidogrel and Nexletol without side effects.  Has not had recent lab work.  Blood pressure today 149/82.  Endorses compliance of CPAP for sleep apnea management.  Denies recent medication changes.  Returns today for evaluation.  Update 09/27/2019: Ms. Cuadros is a 59 year old female who is being seen today for follow-up.  Residual stroke deficits include very mild left hand middle fingertip numbness but overall improving.  She has since established care with mood treatment center in [place] with great improvement of her overall anxiety and stress.  She believes this is greatly impacted her headache/migraine frequency as she believes she had an actual migraine back in November  but has not had any since that time.  She used Aimovig for the month of November but did not continue as she did not have any additional refills.  She was also found to have sleep apnea and has been using her CPAP  machine for the past 3 months.  She continues on Plavix without bleeding or bruising.  Atorvastatin discontinued due to myalgias and placed on Nexletol by PCP.  Blood pressure today 142/88.  Denies new or worsening stroke/TIA symptoms.  Initial visit 06/26/2019 JM: Ms. Harroun is being seen today for hospital follow-up.  Residual stroke deficits mild left finger numbness which can worsen with temperature change but overall improving.  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  She does report high stress with her job position and increased stress when her headache started approximately 4 months ago.  History of migraines with occasional visual auras but has not experienced migraine headache in numerous years.  She was referred to headache wellness clinic and saw Dr. Neale Burly where she received trigger point injections and trialed Robaxin, baclofen and Fioricet without much benefit.  She had previously been on Topamax and beta-blockers for preventative migraine treatment but difficulty tolerating.  She has trialed abortive therapies in the past but had complications.  She is in the process of initiating Aimovig with Dr. Neale Burly but has not yet started.  Current headaches typically start with bilateral neck tension and then experience pain left temporal and frontal region.  These headaches will be associated with photophobia and phonophobia.  She is planning on undergoing HST tomorrow which was ordered by her PCP to assess for potential sleep apnea.  She has completed 3 weeks DAPT and continues on Plavix alone without bleeding or bruising.  Continues on atorvastatin without myalgias.  Does plan on having follow-up with PCP after HST completed and plans on doing lab work at that time.  Blood pressure today satisfactory 122/69.  No further concerns at this time.  Stroke admission 05/26/2019: Ms. BEVERLIE KURIHARA is a 59 y.o. female with history of Htn and migraines  presented with multiple transient  episodes of perioral numbness, left-sided face arm and leg numbness that lasted for a few minutes followed by headache.   Stroke work-up completed with presenting symptoms possibly secondary to migraines versus stroke with questionable tiny acute infarction along the lateral right thalamus as evidenced on MRI secondary to small vessel disease.  Carotid Dopplers unremarkable.  2D echo normal EF without cardiac source of embolus identified.  LDL 175.  A1c 5.7.  Recommended DAPT for 3 weeks and Plavix alone as previously on aspirin.  HTN stable.  Initiated atorvastatin 80 mg daily for HLD management.  Other stroke risk factors include advanced age, former tobacco use, EtOH use, obesity and history of migraines.  Discharged home in stable condition without therapy needs.    ROS:   14 system review of systems performed and negative with exception of anxiety and migraines  PMH:  Past Medical History:  Diagnosis Date  . Hypertension   . Migraines   . Stroke [facility])     PSH:  Past Surgical History:  Procedure Laterality Date  . ABDOMINAL HYSTERECTOMY    . CYST EXCISION      Social History:  Social History   Socioeconomic History  . Marital status: Divorced    Spouse name: Not on file  . Number of children: Not on file  . Years of education: Not on file  .  Highest education level: Not on file  Occupational History  . Not on file  Tobacco Use  . Smoking status: Former Smoker    Years: 5.00    Types: Cigarettes  . Smokeless tobacco: Never Used  Substance and Sexual Activity  . Alcohol use: Yes    Comment: occasional  . Drug use: No  . Sexual activity: Not on file  Other Topics Concern  . Not on file  Social History Narrative  . Not on file   Social Determinants of Health   Financial Resource Strain:   . Difficulty of Paying Living Expenses: Not on file  Food Insecurity:   . Worried About Programme researcher, broadcasting/film/video in the Last Year: Not on file  . Ran Out of Food in the Last Year:  Not on file  Transportation Needs:   . Lack of Transportation (Medical): Not on file  . Lack of Transportation (Non-Medical): Not on file  Physical Activity:   . Days of Exercise per Week: Not on file  . Minutes of Exercise per Session: Not on file  Stress:   . Feeling of Stress : Not on file  Social Connections:   . Frequency of Communication with Friends and Family: Not on file  . Frequency of Social Gatherings with Friends and Family: Not on file  . Attends Religious Services: Not on file  . Active Member of Clubs or Organizations: Not on file  . Attends Banker Meetings: Not on file  . Marital Status: Not on file  Intimate Partner Violence:   . Fear of Current or Ex-Partner: Not on file  . Emotionally Abused: Not on file  . Physically Abused: Not on file  . Sexually Abused: Not on file    Family History:  Family History  Problem Relation Age of Onset  . Heart failure Mother   . Heart failure Father   . Hypertension Other     Medications:   Current Outpatient Medications on File Prior to Visit  Medication Sig Dispense Refill  . Bempedoic Acid (NEXLETOL) 180 MG TABS Take 180 mg by mouth at bedtime.    Marland Kitchen CALCIUM PO Take 1 tablet by mouth daily.    . Cholecalciferol (VITAMIN D3 PO) Take 1 tablet by mouth daily.    . clopidogrel (PLAVIX) 75 MG tablet Take 75 mg by mouth daily.    . Coenzyme Q10 (COQ10) 200 MG CAPS Take 200 mg by mouth daily.     Marland Kitchen FLUoxetine (PROZAC) 40 MG capsule Take 40 mg by mouth daily.    Marland Kitchen gabapentin (NEURONTIN) 300 MG capsule Take 300 mg by mouth 2 (two) times daily. Takes 600    . GLUCOSAMINE-CHONDROITIN PO Take 1 tablet by mouth 2 (two) times daily.    Marland Kitchen HYDROCHLOROTHIAZIDE PO Take 1 tablet by mouth daily.    Marland Kitchen LORazepam (ATIVAN) 0.5 MG tablet Take by mouth every 8 (eight) hours. Dose unknown    . Losartan Potassium (COZAAR PO) Take 1 tablet by mouth daily.    Marland Kitchen MAGNESIUM PO Take 1 tablet by mouth daily.     . Omega-3 Fatty Acids  (FISH OIL PO) Take 1 capsule by mouth 2 (two) times daily.    Marland Kitchen POTASSIUM PO Take 1 tablet by mouth daily.    . rosuvastatin (CRESTOR) 20 MG tablet Take 1 tablet (20 mg total) by mouth daily. 90 tablet 3   No current facility-administered medications on file prior to visit.    Allergies:   Allergies  Allergen Reactions  . Chocolate Other (See Comments)    Consuming a little more than usual can trigger a migraine  . Citrus Other (See Comments)    Consuming a little more than usual can trigger a migraine  . Codeine Other (See Comments)    Headache  . Lipitor [Atorvastatin] Other (See Comments)    Joint pain  . Nitrates, Organic Other (See Comments)    Consuming a little more than usual can trigger a migraine  . Peanut-Containing Drug Products Other (See Comments)    Consuming a little more than usual can trigger a migraine  . Pineapple Other (See Comments)    Consuming a little more than usual can trigger a migraine     Physical Exam  Vitals:   07/04/20 0926  BP: (!) 144/77  Pulse: 63  Weight: 224 lb (101.6 kg)  Height: 5\' 11"  (1.803 m)   Body mass index is 31.24 kg/m. No exam data present  General: well developed, well nourished, very pleasant middle-age Caucasian female, seated, in no evident distress Head: head normocephalic and atraumatic.   Neck: supple with no carotid or supraclavicular bruits Cardiovascular: regular rate and rhythm, no murmurs Musculoskeletal: no deformity Skin:  no rash/petichiae Vascular:  Normal pulses all extremities   Neurologic Exam Mental Status: Awake and fully alert.   Fluent speech and language. Oriented to place and time. Recent and remote memory intact. Attention span, concentration and fund of knowledge appropriate. Mood and affect appropriate.  Cranial Nerves: Pupils equal, briskly reactive to light. Extraocular movements full without nystagmus. Visual fields full to confrontation. Hearing intact. Facial sensation intact. Face,  tongue, palate moves normally and symmetrically.  Motor: Normal bulk and tone. Normal strength in all tested extremity muscles. Sensory.:  Decreased pinprick sensory bilateral hands distally otherwise intact Coordination: Rapid alternating movements normal in all extremities. Finger-to-nose and heel-to-shin performed accurately bilaterally. Gait and Station: Arises from chair without difficulty. Stance is normal. Gait demonstrates normal stride length and balance without use of assistive device Reflexes: 1+ and symmetric; Toes downgoing.        ASSESSMENT/PLAN: ADDYSIN PORCO is a 59 y.o. year old female is being seen today per patient request due to 5-week onset of transient LUE tingling with hypersensation and ataxia as well as slightly decreased reflex.  History of multiple transient episodes of left-sided numbness followed by a headache on 05/26/19 potentially related to complicated migraines vs possible small lateral right thalamus stroke secondary to small vessel disease. Vascular risk factors include HTN, HLD, migraines and OSA on CPAP.      1. Hx of right thalamic stroke:  a. Per visit complaints of worsening LUE numbness/tingling with evidence of ataxia.  MRI negative for acute stroke and EMG/NCV normal.  Possibly recrudescence of prior stroke symptoms vs cervical radiculopathy.  These symptoms have since improved and not experience any new or worsening stroke/TIA symptoms since prior visit b. Continue aspirin 81 mg daily  and Crestor and Nexletol for secondary stroke prevention.   c. Discussed secondary stroke prevention and importance of close PCP follow-up for aggressive stroke risk factor management  2. Chronic cervical pain:  a. Longstanding history previously evaluated by 05/28/19 orthopedics.  Symptomatically managing with dry needling as needed, routine massages and gabapentin.  LUE symptoms possibly cervical radiculopathy and may need to further evaluate with MR cervical  if symptoms return or worsen 3. Chronic migraines:  a. Overall stable with current use of gabapentin 600 mg twice daily (per mood treatment  center) and management of anxiety and stressors.  Currently experiencing approximately 3-4 migraine days per month and recommend trialing abortive therapy with [facility] as triptans contraindicated due to prior intolerance and history of stroke.  Provided samples of Nurtec 75 mg daily as needed for abortive migraine treatment.  She was advised to call office if beneficial and an official order will be placed at that time 4. HTN: BP goal<130/90.  Controlled on losartan and hydrochlorothiazide per PCP 5. HLD: LDL goal<70.  Prior intolerance to atorvastatin.  [facility] 02/2020 [facility] LDL 88 02/2020.  Advised continuation of routine follow-up with PCP for lipid panel monitoring 6. OSA: Ongoing compliance   Follow-up in 6 months or call earlier if needed   CC:  [facility] provider: Dr. Burnard HawthorneSethi Fusco, [place], MD    I spent 35 minutes of face-to-face and non-face-to-face time with patient.  This included previsit chart review, lab review, study review, order entry, electronic health record documentation, patient education regarding history of stroke, chronic migraines and chronic cervical pain and further treatment options, importance of managing stroke risk factors and answered all questions to patient satisfaction   Ihor AustinJessica McCue, AGNP-BC  [facility] [address] [place], [place] [id number]  Phone [phone] Fax [phone] Note: This document was prepared with digital dictation and possible smart phrase technology. Any transcriptional errors that result from this process are unintentional.

## 2020-10-02 ENCOUNTER — Ambulatory Visit
Admission: EM | Admit: 2020-10-02 | Discharge: 2020-10-02 | Disposition: A | Discharge status: Home / Self Care | Payer: PRIVATE HEALTH INSURANCE

## 2020-10-02 ENCOUNTER — Encounter: Payer: Self-pay | Admitting: Emergency Medicine

## 2020-10-02 ENCOUNTER — Other Ambulatory Visit: Payer: Self-pay

## 2020-10-02 DIAGNOSIS — T148XXA Other injury of unspecified body region, initial encounter: Secondary | ICD-10-CM | POA: Diagnosis not present

## 2020-10-02 NOTE — Discharge Instructions (Addendum)
Take OTC Tylenol/ibuprofen as needed for pain  Follow-up with PCP for further reevaluation if symptom does not resolve

## 2020-10-02 NOTE — ED Triage Notes (Signed)
Pt states that she that she bent down to pick something up got caught in a blanket and fell down and landed on the right side. Pt states that she is on blood thinners so the bruising is significant. Pt arm has a knot and is tender to touch

## 2020-10-02 NOTE — ED Provider Notes (Signed)
[facility]   [id number] 10/02/20 Arrival Time: 1723   Chief Complaint  Patient presents with  . Arm Injury     SUBJECTIVE: History from: patient.  Angela Wallace is a 60 y.o. female who is on blood thinner presents to the urgent care with a complaint of right upper arm  bruising for the past week. Developed the symptom after falling on the right upper arm.  She localizes bruising and mild pain to the right upper arm.  She describes the pain as constant and achy.  She has tried OTC medications without relief.  Her symptoms are made worse with ROM.  She denies similar symptoms in the past.  Denies chills, fever, nausea, vomiting, diarrhea.  ROS: As per HPI.  All other pertinent ROS negative.      Past Medical History:  Diagnosis Date  . Hypertension   . Migraines   . Stroke [facility])    Past Surgical History:  Procedure Laterality Date  . ABDOMINAL HYSTERECTOMY    . CYST EXCISION     Allergies  Allergen Reactions  . Chocolate Other (See Comments)    Consuming a little more than usual can trigger a migraine  . Citrus Other (See Comments)    Consuming a little more than usual can trigger a migraine  . Codeine Other (See Comments)    Headache  . Lipitor [Atorvastatin] Other (See Comments)    Joint pain  . Nitrates, Organic Other (See Comments)    Consuming a little more than usual can trigger a migraine  . Peanut-Containing Drug Products Other (See Comments)    Consuming a little more than usual can trigger a migraine  . Pineapple Other (See Comments)    Consuming a little more than usual can trigger a migraine   No current facility-administered medications on file prior to encounter.   Current Outpatient Medications on File Prior to Encounter  Medication Sig Dispense Refill  . Bempedoic Acid (NEXLETOL) 180 MG TABS Take 180 mg by mouth at bedtime.    Marland Kitchen CALCIUM PO Take 1 tablet by mouth daily.    . Cholecalciferol (VITAMIN D3 PO) Take 1 tablet by mouth daily.     . clopidogrel (PLAVIX) 75 MG tablet Take 75 mg by mouth daily.    . Coenzyme Q10 (COQ10) 200 MG CAPS Take 200 mg by mouth daily.     Marland Kitchen FLUoxetine (PROZAC) 40 MG capsule Take 40 mg by mouth daily.    Marland Kitchen gabapentin (NEURONTIN) 300 MG capsule Take 300 mg by mouth 2 (two) times daily. Takes 600    . GLUCOSAMINE-CHONDROITIN PO Take 1 tablet by mouth 2 (two) times daily.    Marland Kitchen HYDROCHLOROTHIAZIDE PO Take 1 tablet by mouth daily.    Marland Kitchen LORazepam (ATIVAN) 0.5 MG tablet Take by mouth every 8 (eight) hours. Dose unknown    . Losartan Potassium (COZAAR PO) Take 1 tablet by mouth daily.    Marland Kitchen MAGNESIUM PO Take 1 tablet by mouth daily.     . Omega-3 Fatty Acids (FISH OIL PO) Take 1 capsule by mouth 2 (two) times daily.    Marland Kitchen POTASSIUM PO Take 1 tablet by mouth daily.    . Rimegepant Sulfate (NURTEC) 75 MG TBDP Take 75 mg by mouth daily as needed (At migraine onset). 4 tablet 0  . rosuvastatin (CRESTOR) 20 MG tablet Take 1 tablet (20 mg total) by mouth daily. 90 tablet 3   Social History   Socioeconomic History  . Marital status: Divorced  Spouse name: Not on file  . Number of children: Not on file  . Years of education: Not on file  . Highest education level: Not on file  Occupational History  . Not on file  Tobacco Use  . Smoking status: Former Smoker    Years: 5.00    Types: Cigarettes  . Smokeless tobacco: Never Used  Substance and Sexual Activity  . Alcohol use: Yes    Comment: occasional  . Drug use: No  . Sexual activity: Not on file  Other Topics Concern  . Not on file  Social History Narrative  . Not on file   Social Determinants of Health   Financial Resource Strain: Not on file  Food Insecurity: Not on file  Transportation Needs: Not on file  Physical Activity: Not on file  Stress: Not on file  Social Connections: Not on file  Intimate Partner Violence: Not on file   Family History  Problem Relation Age of Onset  . Heart failure Mother   . Heart failure Father   .  Hypertension Other     OBJECTIVE:  Vitals:   10/02/20 1754  Pulse: 78  Resp: 18  Temp: 99.3 F (37.4 C)  TempSrc: Oral  SpO2: 97%     Physical Exam Vitals and nursing note reviewed.  Constitutional:      General: She is not in acute distress.    Appearance: Normal appearance. She is normal weight. She is not ill-appearing, toxic-appearing or diaphoretic.  HENT:     Head: Normocephalic.  Cardiovascular:     Rate and Rhythm: Normal rate and regular rhythm.     Pulses: Normal pulses.     Heart sounds: Normal heart sounds. No murmur heard. No friction rub. No gallop.   Pulmonary:     Effort: Pulmonary effort is normal. No respiratory distress.     Breath sounds: Normal breath sounds. No stridor. No wheezing, rhonchi or rales.  Chest:     Chest wall: No tenderness.  Skin:    Findings: Bruising present.     Comments: Collection of blood under skin from bleeding.  Hard to touch.  No drainage or bleeding at this time  Neurological:     Mental Status: She is alert and oriented to person, place, and time.      LABS:  No results found for this or any previous visit (from the past 24 hour(s)).   ASSESSMENT & PLAN:  1. Hematoma of skin   2. Superficial bruising     No orders of the defined types were placed in this encounter.   Discharge Instructions  Take OTC Tylenol/ibuprofen as needed for pain Follow-up with PCP for further reevaluation if symptom does not resolve  Reviewed expectations re: course of current medical issues. Questions answered. Outlined signs and symptoms indicating need for more acute intervention. Patient verbalized understanding. After Visit Summary given.         Durward Parcel, FNP 10/02/20 1904

## 2020-10-15 ENCOUNTER — Encounter (INDEPENDENT_AMBULATORY_CARE_PROVIDER_SITE_OTHER): Payer: Self-pay | Admitting: *Deleted

## 2021-01-02 ENCOUNTER — Encounter: Payer: Self-pay | Admitting: Adult Health

## 2021-01-02 ENCOUNTER — Ambulatory Visit: Payer: No Typology Code available for payment source | Admitting: Adult Health

## 2021-01-02 VITALS — BP 149/83 | HR 70 | Ht 72.0 in | Wt 227.0 lb

## 2021-01-02 DIAGNOSIS — I639 Cerebral infarction, unspecified: Secondary | ICD-10-CM | POA: Diagnosis not present

## 2021-01-02 DIAGNOSIS — G43109 Migraine with aura, not intractable, without status migrainosus: Secondary | ICD-10-CM | POA: Diagnosis not present

## 2021-01-02 DIAGNOSIS — R7303 Prediabetes: Secondary | ICD-10-CM | POA: Diagnosis not present

## 2021-01-02 DIAGNOSIS — Z5181 Encounter for therapeutic drug level monitoring: Secondary | ICD-10-CM

## 2021-01-02 DIAGNOSIS — E785 Hyperlipidemia, unspecified: Secondary | ICD-10-CM

## 2021-01-02 DIAGNOSIS — I6381 Other cerebral infarction due to occlusion or stenosis of small artery: Secondary | ICD-10-CM

## 2021-01-02 MED ORDER — PROPRANOLOL HCL ER 60 MG PO CP24: 60.0000 mg | ORAL_CAPSULE | Freq: Every day | ORAL | 5 refills | Status: DC

## 2021-01-02 NOTE — Patient Instructions (Addendum)
Recommend trying propraolol 60mg  for migraines but can also help with anxiety   We will check your cholesterol level, A1c and complete metabolic panel   Continue clopidogrel 75 mg daily  and Nexletol and Crestor for secondary stroke prevention  Continue to follow up with PCP regarding cholesterol and blood pressure management  Maintain strict control of hypertension with blood pressure goal below 130/90 and cholesterol with LDL cholesterol (bad cholesterol) goal below 70 mg/dL.   Continue nightly use of CPAP for sleep apnea management      Followup in the future with me in 6 months or call earlier if needed       Thank you for coming to see at [facility]. I hope we have been able to provide you high quality care today.  You may receive a patient satisfaction survey over the next few weeks. We would appreciate your feedback and comments so that we may continue to improve ourselves and the health of our patients.

## 2021-01-02 NOTE — Progress Notes (Signed)
[facility] [address] [place]. [place] [id number] [phone]       STROKE FOLLOW UP NOTE  [name] Date of Birth:  05/10/1961 Medical Record Number:  [id number]   Reason for visit: Stroke and migraine f/u    CHIEF COMPLAINT:  Chief Complaint  Patient presents with  . Follow-up    RM 14 alone Pt is well, no problems. Stable on stroke standpoint     HPI:   Today, 01/02/2021, Angela Wallace returns for 1471-month stroke and migraine follow-up unaccompanied  Has been stable from stroke standpoint without new stroke/TIA symptoms Reports compliance on Plavix, Nexletol and Crestor without associated side effects Blood pressure today 149/83 -occasionally monitors at home which has been stable Remains compliant on CPAP for OSA management  Migraines have been relatively stable but she does note worsening around weather changes especially during the colder months as well as with increased stressors. She did try Nurtec but no benefit. She will use occasional advil and tylenol with some benefit. She continues to follow [facility] for anxiety which continues to be beneficial. Remains on gabapentin 300mg  twice daily with some benefit of migraines and chronic cervical pain.    No further concerns at this time     History provided for reference purposes only Update 07/04/2020 JM: Angela Wallace returns for routine follow-up.  Stable since prior visit without new or worsening stroke/TIA symptoms.  She reports resolution of worsening left arm paresthesias since prior visit.  Underwent MRI which did not show new abnormality and EMG/NCV unremarkable.  She does have chronic cervical pain which has been relatively stable.  She will do dry needling as needed with great benefit.  Reports chronic migraines relatively stable experiencing approximately 3-4 migraine days per month.  Typically either left or right temporal pulsating pain associated with  photophobia and phonophobia but denies recent nausea/vomiting.  She continues to follow [place] [facility] [place] for generalized anxiety with great benefit as well as improvement of migraines.  Recently they increased dose of gabapentin currently on 600 mg twice daily with improvement of cervical neck pain and migraines.  Also remains on fluoxetine and lorazepam as needed for increased anxiety or migraine abortive. Remains on Plavix without bleeding or bruising.  Remains on Nexletol as well as Crestor which was initiated after prior visit for elevated LDL which she has remained on tolerating well without side effects.  Blood pressure today 144/77.  Monitors at home which has been stable.  Reports ongoing compliance with CPAP for OSA management.  No further concerns at this time.  Update 03/17/2020 JM: Angela Wallace is being seen per patient request due to progressively worsening tingling of LUE over the past 5 weeks.  Previously seen for possible right thalamic stroke vs migraine with residual left hand middle fingertip numbness and over the past 5 weeks, now experiencing transient tingling whole hand and forearm.  Symptoms are transient with a vibratory/tingling sensation and can last for a couple of minutes and then subside and typically do not past the elbow.  Denies weakness, numbness, nerve pain, lower extremity symptoms or any other associated symptoms. Denies wrist, elbow, neck or shoulder pain. History of headaches/migraines which have been stable and his only experience 1 migraine over the past 4 months.  Reports increased stressors due to work issues and recent passing of her father which preceded the onset of symptoms.  Continues to follow with psychology/psychiatry with great benefit and  ongoing use of fluoxetine and gabapentin but does admit to increased anxiety related to stressors. Remains on all prescribed medications including clopidogrel and Nexletol without side effects.   Has not had recent lab work.  Blood pressure today 149/82.  Endorses compliance of CPAP for sleep apnea management.  Denies recent medication changes.  Returns today for evaluation.  Update 09/27/2019: Angela Wallace is a 60 year old female who is being seen today for follow-up.  Residual stroke deficits include very mild left hand middle fingertip numbness but overall improving.  She has since established care with [facility] treatment center in [place] with great improvement of her overall anxiety and stress.  She believes this is greatly impacted her headache/migraine frequency as she believes she had an actual migraine back in November but has not had any since that time.  She used Aimovig for the month of November but did not continue as she did not have any additional refills.  She was also found to have sleep apnea and has been using her CPAP machine for the past 3 months.  She continues on Plavix without bleeding or bruising.  Atorvastatin discontinued due to myalgias and placed on Nexletol by PCP.  Blood pressure today 142/88.  Denies new or worsening stroke/TIA symptoms.  Initial visit 06/26/2019 JM: Angela Wallace is being seen today for hospital follow-up.  Residual stroke deficits mild left finger numbness which can worsen with temperature change but overall improving.  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  She does report high stress with her job position and increased stress when her headache started approximately 4 months ago.  History of migraines with occasional visual auras but has not experienced migraine headache in numerous years.  She was referred to headache wellness clinic and saw Dr. Neale Burly where she received trigger point injections and trialed Robaxin, baclofen and Fioricet without much benefit.  She had previously been on Topamax and beta-blockers for preventative migraine treatment but difficulty tolerating.  She has trialed abortive therapies in the past but had  complications.  She is in the process of initiating Aimovig with Dr. Neale Burly but has not yet started.  Current headaches typically start with bilateral neck tension and then experience pain left temporal and frontal region.  These headaches will be associated with photophobia and phonophobia.  She is planning on undergoing HST tomorrow which was ordered by her PCP to assess for potential sleep apnea.  She has completed 3 weeks DAPT and continues on Plavix alone without bleeding or bruising.  Continues on atorvastatin without myalgias.  Does plan on having follow-up with PCP after HST completed and plans on doing lab work at that time.  Blood pressure today satisfactory 122/69.  No further concerns at this time.  Stroke admission 05/26/2019: [name] is a 60 y.o. female with history of Htn and migraines  presented with multiple transient episodes of perioral numbness, left-sided face arm and leg numbness that lasted for a few minutes followed by headache.   Stroke work-up completed with presenting symptoms possibly secondary to migraines versus stroke with questionable tiny acute infarction along the lateral right thalamus as evidenced on MRI secondary to small vessel disease.  Carotid Dopplers unremarkable.  2D echo normal EF without cardiac source of embolus identified.  LDL 175.  A1c 5.7.  Recommended DAPT for 3 weeks and Plavix alone as previously on aspirin.  HTN stable.  Initiated atorvastatin 80 mg daily for HLD management.  Other stroke risk factors  include advanced age, former tobacco use, EtOH use, obesity and history of migraines.  Discharged home in stable condition without therapy needs.    ROS:   14 system review of systems performed and negative with exception of anxiety and migraines  PMH:  Past Medical History:  Diagnosis Date  . Hypertension   . Migraines   . Stroke [facility])     PSH:  Past Surgical History:  Procedure Laterality Date  . ABDOMINAL HYSTERECTOMY    . CYST  EXCISION      Social History:  Social History   Socioeconomic History  . Marital status: Divorced    Spouse name: Not on file  . Number of children: Not on file  . Years of education: Not on file  . Highest education level: Not on file  Occupational History  . Not on file  Tobacco Use  . Smoking status: Former Smoker    Years: 5.00    Types: Cigarettes  . Smokeless tobacco: Never Used  Substance and Sexual Activity  . Alcohol use: Yes    Comment: occasional  . Drug use: No  . Sexual activity: Not on file  Other Topics Concern  . Not on file  Social History Narrative  . Not on file   Social Determinants of Health   Financial Resource Strain: Not on file  Food Insecurity: Not on file  Transportation Needs: Not on file  Physical Activity: Not on file  Stress: Not on file  Social Connections: Not on file  Intimate Partner Violence: Not on file    Family History:  Family History  Problem Relation Age of Onset  . Heart failure Mother   . Heart failure Father   . Hypertension Other     Medications:   Current Outpatient Medications on File Prior to Visit  Medication Sig Dispense Refill  . Bempedoic Acid (NEXLETOL) 180 MG TABS Take 180 mg by mouth at bedtime.    Marland Kitchen CALCIUM PO Take 1 tablet by mouth daily.    . Cholecalciferol (VITAMIN D3 PO) Take 1 tablet by mouth daily.    . clopidogrel (PLAVIX) 75 MG tablet Take 75 mg by mouth daily.    . Coenzyme Q10 (COQ10) 200 MG CAPS Take 200 mg by mouth daily.     Marland Kitchen FLUoxetine (PROZAC) 40 MG capsule Take 40 mg by mouth daily.    Marland Kitchen gabapentin (NEURONTIN) 300 MG capsule Take 300 mg by mouth 2 (two) times daily. Takes 600    . GLUCOSAMINE-CHONDROITIN PO Take 1 tablet by mouth 2 (two) times daily.    Marland Kitchen HYDROCHLOROTHIAZIDE PO Take 1 tablet by mouth daily.    Marland Kitchen LORazepam (ATIVAN) 0.5 MG tablet Take by mouth every 8 (eight) hours. Dose unknown    . Losartan Potassium (COZAAR PO) Take 1 tablet by mouth daily.    Marland Kitchen MAGNESIUM PO Take  1 tablet by mouth daily.    . Omega-3 Fatty Acids (FISH OIL PO) Take 1 capsule by mouth 2 (two) times daily.    Marland Kitchen POTASSIUM PO Take 1 tablet by mouth daily.    . rosuvastatin (CRESTOR) 20 MG tablet Take 1 tablet (20 mg total) by mouth daily. 90 tablet 3   No current facility-administered medications on file prior to visit.    Allergies:   Allergies  Allergen Reactions  . Chocolate Other (See Comments)    Consuming a little more than usual can trigger a migraine  . Citrus Other (See Comments)    Consuming a little  more than usual can trigger a migraine  . Codeine Other (See Comments)    Headache  . Lipitor [Atorvastatin] Other (See Comments)    Joint pain  . Nitrates, Organic Other (See Comments)    Consuming a little more than usual can trigger a migraine  . Peanut-Containing Drug Products Other (See Comments)    Consuming a little more than usual can trigger a migraine  . Pineapple Other (See Comments)    Consuming a little more than usual can trigger a migraine     Physical Exam  Vitals:   01/02/21 0746  BP: (!) 149/83  Pulse: 70  Weight: 227 lb (103 kg)  Height: 6' (1.829 m)   Body mass index is 30.79 kg/m. No exam data present  General: well developed, well nourished, very pleasant middle-age Caucasian female, seated, in no evident distress Head: head normocephalic and atraumatic.   Neck: supple with no carotid or supraclavicular bruits Cardiovascular: regular rate and rhythm, no murmurs Musculoskeletal: Limited R shoulder ROM 2/2 pain Skin:  no rash/petichiae Vascular:  Normal pulses all extremities   Neurologic Exam Mental Status: Awake and fully alert.   Fluent speech and language. Oriented to place and time. Recent and remote memory intact. Attention span, concentration and fund of knowledge appropriate. Mood and affect appropriate.  Cranial Nerves: Pupils equal, briskly reactive to light. Extraocular movements full without nystagmus. Visual fields full to  confrontation. Hearing intact. Facial sensation intact. Face, tongue, palate moves normally and symmetrically.  Motor: Normal bulk and tone. Normal strength in all tested extremity muscles. Sensory.:  Intact to light touch, vibratory and pinprick sensation Coordination: Rapid alternating movements normal in all extremities. Finger-to-nose and heel-to-shin performed accurately bilaterally. Gait and Station: Arises from chair without difficulty. Stance is normal. Gait demonstrates normal stride length and balance without use of assistive device Reflexes: 1+ and symmetric; Toes downgoing.        ASSESSMENT/PLAN: Angela Wallace is a 60 y.o. year old female is being seen today per patient request due to 5-week onset of transient LUE tingling with hypersensation and ataxia as well as slightly decreased reflex.  History of multiple transient episodes of left-sided numbness followed by a headache on 05/26/19 potentially related to complicated migraines vs possible small lateral right thalamus stroke secondary to small vessel disease. Vascular risk factors include HTN, HLD, migraines and OSA on CPAP.      1. Hx of right thalamic stroke:  a. Continue aspirin 81 mg daily  and Crestor and Nexletol for secondary stroke prevention.   b. Discussed secondary stroke prevention and importance of close PCP follow-up for aggressive stroke risk factor management  2. Migraines with aura, chronic:  a. Initiate propranolol ER 60 mg daily for both migraine prophylaxis which may also benefit anxiety.  Advised to closely monitor BP and HR and advised to call with any difficulty tolerating or lack of benefit. b. Samples of Ubrelvy 100mg  provided for emergent relief -triptans contraindicated with history of stroke c. Continue gabapentin 300 mg twice daily per psych  3. HTN: BP goal<130/90.  Controlled on losartan and hydrochlorothiazide per PCP 4. HLD: LDL goal<70.  On Crestor and Nexletol per PCP- prior LDL 88 02/2020.   Repeat lipid panel today 5. Pre-DM: A1c goal<7.  Repeat A1c today 6. OSA: Briefly discussed importance of continued nightly compliance with CPAP   Follow-up in 6 months or call earlier if needed   CC:  [facility] provider: Dr. 03/2020, MD    Burnard Hawthorne  Caryn Section  [facility] [address] [place], [place] [address]  Phone [phone] Fax [phone] Note: This document was prepared with digital dictation and possible smart phrase technology. Any transcriptional errors that result from this process are unintentional.

## 2021-01-03 LAB — COMPREHENSIVE METABOLIC PANEL
ALT: 19 IU/L (ref 0–32)
AST: 20 IU/L (ref 0–40)
Albumin/Globulin Ratio: 2.5 — ABNORMAL HIGH (ref 1.2–2.2)
Albumin: 4.9 g/dL — ABNORMAL HIGH (ref 3.8–4.8)
Alkaline Phosphatase: 70 IU/L (ref 44–121)
BUN/Creatinine Ratio: 20 (ref 12–28)
BUN: 28 mg/dL — ABNORMAL HIGH (ref 8–27)
Bilirubin Total: 0.5 mg/dL (ref 0.0–1.2)
CO2: 25 mmol/L (ref 20–29)
Calcium: 10.3 mg/dL (ref 8.7–10.3)
Chloride: 102 mmol/L (ref 96–106)
Creatinine, Ser: 1.43 mg/dL — ABNORMAL HIGH (ref 0.57–1.00)
Globulin, Total: 2 g/dL (ref 1.5–4.5)
Glucose: 105 mg/dL — ABNORMAL HIGH (ref 65–99)
Potassium: 4 mmol/L (ref 3.5–5.2)
Sodium: 144 mmol/L (ref 134–144)
Total Protein: 6.9 g/dL (ref 6.0–8.5)
eGFR: 42 mL/min/{1.73_m2} — ABNORMAL LOW (ref >59)

## 2021-01-03 LAB — HEMOGLOBIN A1C
Est. average glucose Bld gHb Est-mCnc: 123 mg/dL
Hgb A1c MFr Bld: 5.9 % — ABNORMAL HIGH (ref 4.8–5.6)

## 2021-01-03 LAB — LIPID PANEL
Chol/HDL Ratio: 3 ratio (ref 0.0–4.4)
Cholesterol, Total: 114 mg/dL (ref 100–199)
HDL: 38 mg/dL — ABNORMAL LOW (ref >39)
LDL Chol Calc (NIH): 50 mg/dL (ref 0–99)
Triglycerides: 155 mg/dL — ABNORMAL HIGH (ref 0–149)
VLDL Cholesterol Cal: 26 mg/dL (ref 5–40)

## 2021-01-06 NOTE — Progress Notes (Signed)
I agree with the above plan 

## 2021-01-07 ENCOUNTER — Telehealth: Payer: Self-pay

## 2021-01-07 NOTE — Telephone Encounter (Signed)
-----   Message from Ihor Austin, NP sent at 01/07/2021  7:59 AM EDT ----- Please advise patient that recent LDL or bad cholesterol satisfactory currently at 50 with goal less than 70.  Slight increase of A1c currently at 5.9 but still within prediabetic range.  Slight worsening of kidney function but this could be due to many different factors -would recommend further discussion with PCP regarding this.  Thank you.

## 2021-01-07 NOTE — Telephone Encounter (Signed)
Contacted pt regarding labs, informed her that that recent LDL or bad cholesterol satisfactory currently at 50 with goal less than 70. Slight increase of A1c currently at 5.9 but still within prediabetic range. Slight worsening of kidney function but this could be due to many different factors. Shanda Bumps would recommend further discussion with PCP regarding this. She states she was concerns about labs being too bad but is pleased it isn't horrible. Asked if artificial sweaters affected A1c levels and I informed her they do not but still limit/minimize intake. Advised to call the office with further questions, She undertood

## 2021-03-21 ENCOUNTER — Other Ambulatory Visit: Payer: Self-pay | Admitting: Adult Health

## 2021-07-03 ENCOUNTER — Other Ambulatory Visit: Payer: Self-pay

## 2021-07-03 ENCOUNTER — Encounter: Payer: Self-pay | Admitting: Adult Health

## 2021-07-03 ENCOUNTER — Ambulatory Visit: Payer: No Typology Code available for payment source | Admitting: Adult Health

## 2021-07-03 VITALS — BP 143/72 | HR 68 | Ht 72.0 in | Wt 233.0 lb

## 2021-07-03 DIAGNOSIS — I10 Essential (primary) hypertension: Secondary | ICD-10-CM

## 2021-07-03 DIAGNOSIS — I6381 Other cerebral infarction due to occlusion or stenosis of small artery: Secondary | ICD-10-CM

## 2021-07-03 DIAGNOSIS — R7303 Prediabetes: Secondary | ICD-10-CM | POA: Diagnosis not present

## 2021-07-03 DIAGNOSIS — G43109 Migraine with aura, not intractable, without status migrainosus: Secondary | ICD-10-CM

## 2021-07-03 DIAGNOSIS — G4733 Obstructive sleep apnea (adult) (pediatric): Secondary | ICD-10-CM

## 2021-07-03 DIAGNOSIS — E785 Hyperlipidemia, unspecified: Secondary | ICD-10-CM | POA: Diagnosis not present

## 2021-07-03 DIAGNOSIS — Z9989 Dependence on other enabling machines and devices: Secondary | ICD-10-CM

## 2021-07-03 MED ORDER — BACLOFEN 10 MG PO TABS: 10.0000 mg | ORAL_TABLET | Freq: Two times a day (BID) | ORAL | 5 refills | Status: DC | PRN

## 2021-07-03 MED ORDER — NURTEC 75 MG PO TBDP: 75.0000 mg | ORAL_TABLET | Freq: Every day | ORAL | 0 refills | Status: DC | PRN

## 2021-07-03 MED ORDER — PROPRANOLOL HCL ER 60 MG PO CP24: 60.0000 mg | ORAL_CAPSULE | Freq: Every day | ORAL | 3 refills | Status: DC

## 2021-07-03 NOTE — Progress Notes (Signed)
[facility] [address] [place]. [place] [address] [phone]       STROKE FOLLOW UP NOTE  [name] Date of Birth:  March 16, 1961 Medical Record Number:  [id number]   Reason for visit: Stroke and migraine f/u    CHIEF COMPLAINT:  Chief Complaint  Patient presents with   Thalamic stroke    Rm 2, 6 month FU "no new concerns"     HPI:   Update 07/03/2021 JM: Returns for 59-month stroke and migraine follow-up unaccompanied  Stable from stroke standpoint -no new stroke/TIA symptoms Compliant on Plavix, Nexletol and Crestor -denies side effects Blood pressure today 143/72 Reports compliance on CPAP  Migraines have greatly improved since starting propranolol after prior visit has had 2 migraines since prior visit but not as severe Bernita Raisin - will stop migraine but will return within 1-2 hrs  - has not tried to retake in 2 hours She did try baclofen for headache which greatly helped - does not have active prescription   Continues to follow with psychiatry/psychology routinely   No further concerns at this time     History provided for reference purposes only Update 01/02/2021 JM: Angela Wallace returns for 89-month stroke and migraine follow-up unaccompanied  Has been stable from stroke standpoint without new stroke/TIA symptoms Reports compliance on Plavix, Nexletol and Crestor without associated side effects Blood pressure today 149/83 -occasionally monitors at home which has been stable Remains compliant on CPAP for OSA management  Migraines have been relatively stable but she does note worsening around weather changes especially during the colder months as well as with increased stressors. She did try Nurtec but no benefit. She will use occasional advil and tylenol with some benefit. She continues to follow with pysch at [facility] for anxiety which continues to be beneficial. Remains on gabapentin 300mg  twice daily with some benefit  of migraines and chronic cervical pain.    No further concerns at this time  Update 07/04/2020 JM: Angela Wallace returns for routine follow-up.  Stable since prior visit without new or worsening stroke/TIA symptoms.  She reports resolution of worsening left arm paresthesias since prior visit.  Underwent MRI which did not show new abnormality and EMG/NCV unremarkable.  She does have chronic cervical pain which has been relatively stable.  She will do dry needling as needed with great benefit.  Reports chronic migraines relatively stable experiencing approximately 3-4 migraine days per month.  Typically either left or right temporal pulsating pain associated with photophobia and phonophobia but denies recent nausea/vomiting.  She continues to follow with mood treatment center in [place] for generalized anxiety with great benefit as well as improvement of migraines.  Recently they increased dose of gabapentin currently on 600 mg twice daily with improvement of cervical neck pain and migraines.  Also remains on fluoxetine and lorazepam as needed for increased anxiety or migraine abortive. Remains on Plavix without bleeding or bruising.  Remains on Nexletol as well as Crestor which was initiated after prior visit for elevated LDL which she has remained on tolerating well without side effects.  Blood pressure today 144/77.  Monitors at home which has been stable.  Reports ongoing compliance with CPAP for OSA management.  No further concerns at this time.  Update 03/17/2020 JM: Angela Wallace is being seen per patient request due to progressively worsening tingling of LUE over the past 5 weeks.  Previously seen for possible right thalamic stroke vs migraine with residual left hand middle fingertip  numbness and over the past 5 weeks, now experiencing transient tingling whole hand and forearm.  Symptoms are transient with a vibratory/tingling sensation and can last for a couple of minutes and then subside and  typically do not past the elbow.  Denies weakness, numbness, nerve pain, lower extremity symptoms or any other associated symptoms. Denies wrist, elbow, neck or shoulder pain. History of headaches/migraines which have been stable and his only experience 1 migraine over the past 4 months.  Reports increased stressors due to work issues and recent passing of her father which preceded the onset of symptoms.  Continues to follow with psychology/psychiatry with great benefit and ongoing use of fluoxetine and gabapentin but does admit to increased anxiety related to stressors. Remains on all prescribed medications including clopidogrel and Nexletol without side effects.  Has not had recent lab work.  Blood pressure today 149/82.  Endorses compliance of CPAP for sleep apnea management.  Denies recent medication changes.  Returns today for evaluation.  Update 09/27/2019: Angela Wallace is a 60 year old female who is being seen today for follow-up.  Residual stroke deficits include very mild left hand middle fingertip numbness but overall improving.  She has since established care with mood treatment center in [place] with great improvement of her overall anxiety and stress.  She believes this is greatly impacted her headache/migraine frequency as she believes she had an actual migraine back in November but has not had any since that time.  She used Aimovig for the month of November but did not continue as she did not have any additional refills.  She was also found to have sleep apnea and has been using her CPAP machine for the past 3 months.  She continues on Plavix without bleeding or bruising.  Atorvastatin discontinued due to myalgias and placed on Nexletol by PCP.  Blood pressure today 142/88.  Denies new or worsening stroke/TIA symptoms.  Initial visit 06/26/2019 JM: Angela Wallace is being seen today for hospital follow-up.  Residual stroke deficits mild left finger numbness which can worsen with temperature  change but overall improving.  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  She does report high stress with her job position and increased stress when her headache started approximately 4 months ago.  History of migraines with occasional visual auras but has not experienced migraine headache in numerous years.  She was referred to headache wellness clinic and saw Dr. Neale Burly where she received trigger point injections and trialed Robaxin, baclofen and Fioricet without much benefit.  She had previously been on Topamax and beta-blockers for preventative migraine treatment but difficulty tolerating.  She has trialed abortive therapies in the past but had complications.  She is in the process of initiating Aimovig with Dr. Neale Burly but has not yet started.  Current headaches typically start with bilateral neck tension and then experience pain left temporal and frontal region.  These headaches will be associated with photophobia and phonophobia.  She is planning on undergoing HST tomorrow which was ordered by her PCP to assess for potential sleep apnea.  She has completed 3 weeks DAPT and continues on Plavix alone without bleeding or bruising.  Continues on atorvastatin without myalgias.  Does plan on having follow-up with PCP after HST completed and plans on doing lab work at that time.  Blood pressure today satisfactory 122/69.  No further concerns at this time.  Stroke admission 05/26/2019: Angela Wallace is a 60 y.o. female with history of Htn  and migraines  presented with multiple transient episodes of perioral numbness, left-sided face arm and leg numbness that lasted for a few minutes followed by headache.   Stroke work-up completed with presenting symptoms possibly secondary to migraines versus stroke with questionable tiny acute infarction along the lateral right thalamus as evidenced on MRI secondary to small vessel disease.  Carotid Dopplers unremarkable.  2D echo normal EF without cardiac  source of embolus identified.  LDL 175.  A1c 5.7.  Recommended DAPT for 3 weeks and Plavix alone as previously on aspirin.  HTN stable.  Initiated atorvastatin 80 mg daily for HLD management.  Other stroke risk factors include advanced age, former tobacco use, EtOH use, obesity and history of migraines.  Discharged home in stable condition without therapy needs.    ROS:   14 system review of systems performed and negative with exception of anxiety and migraines  PMH:  Past Medical History:  Diagnosis Date   Hypertension    Migraines    Stroke (HCC)     PSH:  Past Surgical History:  Procedure Laterality Date   ABDOMINAL HYSTERECTOMY     CYST EXCISION      Social History:  Social History   Socioeconomic History   Marital status: Divorced    Spouse name: Not on file   Number of children: Not on file   Years of education: Not on file   Highest education level: Not on file  Occupational History   Not on file  Tobacco Use   Smoking status: Former    Years: 5.00    Types: Cigarettes   Smokeless tobacco: Never  Substance and Sexual Activity   Alcohol use: Yes    Comment: occasional   Drug use: No   Sexual activity: Not on file  Other Topics Concern   Not on file  Social History Narrative   Not on file   Social Determinants of Health   Financial Resource Strain: Not on file  Food Insecurity: Not on file  Transportation Needs: Not on file  Physical Activity: Not on file  Stress: Not on file  Social Connections: Not on file  Intimate Partner Violence: Not on file    Family History:  Family History  Problem Relation Age of Onset   Heart failure Mother    Heart failure Father    Hypertension Other     Medications:   Current Outpatient Medications on File Prior to Visit  Medication Sig Dispense Refill   Bempedoic Acid (NEXLETOL) 180 MG TABS Take 180 mg by mouth at bedtime.     CALCIUM PO Take 1 tablet by mouth daily.     Cholecalciferol (VITAMIN D3 PO) Take 1  tablet by mouth daily.     clopidogrel (PLAVIX) 75 MG tablet Take 75 mg by mouth daily.     Coenzyme Q10 (COQ10) 200 MG CAPS Take 200 mg by mouth daily.      FLUoxetine (PROZAC) 40 MG capsule Take 40 mg by mouth daily.     gabapentin (NEURONTIN) 300 MG capsule Take 300 mg by mouth 2 (two) times daily. Takes 600     GLUCOSAMINE-CHONDROITIN PO Take 1 tablet by mouth 2 (two) times daily.     HYDROCHLOROTHIAZIDE PO Take 1 tablet by mouth daily.     LORazepam (ATIVAN) 0.5 MG tablet Take by mouth every 8 (eight) hours. Dose unknown     losartan (COZAAR) 50 MG tablet Take 50 mg by mouth daily.     MAGNESIUM PO Take  1 tablet by mouth daily.     Omega-3 Fatty Acids (FISH OIL PO) Take 1 capsule by mouth 2 (two) times daily.     POTASSIUM PO Take 1 tablet by mouth daily.     propranolol ER (INDERAL LA) 60 MG 24 hr capsule Take 1 capsule (60 mg total) by mouth daily. 30 capsule 5   rosuvastatin (CRESTOR) 20 MG tablet TAKE ONE TABLET (  TOTAL) BY MOUTH DAILY 90 tablet 3   No current facility-administered medications on file prior to visit.    Allergies:   Allergies  Allergen Reactions   Chocolate Other (See Comments)    Consuming a little more than usual can trigger a migraine   Citrus Other (See Comments)    Consuming a little more than usual can trigger a migraine   Codeine Other (See Comments)    Headache   Lipitor [Atorvastatin] Other (See Comments)    Joint pain   Nitrates, Organic Other (See Comments)    Consuming a little more than usual can trigger a migraine   Peanut-Containing Drug Products Other (See Comments)    Consuming a little more than usual can trigger a migraine   Pineapple Other (See Comments)    Consuming a little more than usual can trigger a migraine     Physical Exam  Vitals:   07/03/21 0750  BP: (!) 143/72  Pulse: 68  Weight: 233 lb (105.7 kg)  Height: 6' (1.829 m)    Body mass index is 31.6 kg/m. No results found.  General: well developed, well  nourished, very pleasant middle-age Caucasian female, seated, in no evident distress Head: head normocephalic and atraumatic.   Neck: supple with no carotid or supraclavicular bruits Cardiovascular: regular rate and rhythm, no murmurs Skin:  no rash/petichiae Vascular:  Normal pulses all extremities   Neurologic Exam Mental Status: Awake and fully alert.   Fluent speech and language. Oriented to place and time. Recent and remote memory intact. Attention span, concentration and fund of knowledge appropriate. Mood and affect appropriate.  Cranial Nerves: Pupils equal, briskly reactive to light. Extraocular movements full without nystagmus. Visual fields full to confrontation. Hearing intact. Facial sensation intact. Face, tongue, palate moves normally and symmetrically.  Motor: Normal bulk and tone. Normal strength in all tested extremity muscles. Sensory.:  Intact to light touch, vibratory and pinprick sensation Coordination: Rapid alternating movements normal in all extremities. Finger-to-nose and heel-to-shin performed accurately bilaterally. Gait and Station: Arises from chair without difficulty. Stance is normal. Gait demonstrates normal stride length and balance without use of assistive device Reflexes: 1+ and symmetric; Toes downgoing.        ASSESSMENT/PLAN: Angela Wallace is a 60 y.o. year old female is being seen today per patient request due to 5-week onset of transient LUE tingling with hypersensation and ataxia as well as slightly decreased reflex.  History of multiple transient episodes of left-sided numbness followed by a headache on 05/26/19 potentially related to complicated migraines vs possible small lateral right thalamus stroke secondary to small vessel disease. Vascular risk factors include HTN, HLD, migraines and OSA on CPAP.      Hx of right thalamic stroke:  Continue aspirin 81 mg daily  and Crestor and Nexletol for secondary stroke prevention.   Discussed  secondary stroke prevention and importance of close PCP follow-up for aggressive stroke risk factor management  Migraines with aura, chronic:  continue propranolol ER 60 mg daily - refill provided Start baclofen  BID PRN for onset of migraine  headaches Retry Nurtec 75mg  PRN - if no benefit, will restart Ubrelvy - sample provided Continue gabapentin 300 mg twice daily per psych  HTN: BP goal<130/90.  Controlled on losartan and hydrochlorothiazide per PCP HLD: LDL goal<70.  On Crestor and Nexletol per PCP- prior LDL 50 12/2020. Pre-DM: A1c goal<7. Prior A1c 5.9 (12/2020) OSA: Briefly discussed importance of continued nightly compliance with CPAP   Follow-up in 6 months or call earlier if needed   CC:  01-13-1997, MD    I spent 34 minutes of face-to-face and non-face-to-face time with patient.  This included previsit chart review, lab review, study review, order entry, electronic health record documentation, patient education and discussion regarding history of prior stroke, secondary stroke prevention measures and aggressive stroke risk factor management, migraines with aura and medication use and answered all other questions to patient satisfaction    Elfredia Nevins, AGNP-BC  [facility] [address] [place], [place]  Phone [phone] Fax [phone] Note: This document was prepared with digital dictation and possible smart phrase technology. Any transcriptional errors that result from this process are unintentional.

## 2021-07-03 NOTE — Patient Instructions (Signed)
Continue propranolol 60mg  nightly for migraine prevention  Retry Nurtec 75mg  PRN for abortive migraine therapy - you can also take prophylactically if you feel a migraine coming on - do not take more than 1 tablet in 24 hours - if beneficial please let me know and I will place an order - if no benefit, we can restart Ubrelvy   Prescription sent in for baclofen 10mg  twice daily as needed for migraine headaches  Continue aspirin 81 mg daily  and Crestor  for secondary stroke prevention  Continue to follow up with PCP regarding cholesterol, blood pressure and pre-DM management  Maintain strict control of hypertension with blood pressure goal below 130/90, diabetes with hemoglobin A1c goal below 7.0% and cholesterol with LDL cholesterol (bad cholesterol) goal below 70 mg/dL.       Followup in the future with me in 6 months or call earlier if needed       Thank you for coming to see at [facility]. I hope we have been able to provide you high quality care today.  You may receive a patient satisfaction survey over the next few weeks. We would appreciate your feedback and comments so that we may continue to improve ourselves and the health of our patients.

## 2021-12-11 ENCOUNTER — Encounter: Payer: Self-pay | Admitting: Cardiology

## 2021-12-11 ENCOUNTER — Ambulatory Visit (INDEPENDENT_AMBULATORY_CARE_PROVIDER_SITE_OTHER): Payer: No Typology Code available for payment source | Admitting: Cardiology

## 2021-12-11 VITALS — BP 116/78 | HR 67 | Ht 71.6 in | Wt 230.0 lb

## 2021-12-11 DIAGNOSIS — Z8673 Personal history of transient ischemic attack (TIA), and cerebral infarction without residual deficits: Secondary | ICD-10-CM | POA: Diagnosis not present

## 2021-12-11 DIAGNOSIS — I447 Left bundle-branch block, unspecified: Secondary | ICD-10-CM

## 2021-12-11 DIAGNOSIS — Z01818 Encounter for other preprocedural examination: Secondary | ICD-10-CM

## 2021-12-11 DIAGNOSIS — I10 Essential (primary) hypertension: Secondary | ICD-10-CM

## 2021-12-11 DIAGNOSIS — E782 Mixed hyperlipidemia: Secondary | ICD-10-CM

## 2021-12-11 NOTE — Patient Instructions (Signed)
Medication Instructions:  ?Your physician recommends that you continue on your current medications as directed. Please refer to the Current Medication list given to you today. ? ? ?Labwork: ?None today ? ?Testing/Procedures: ?Your physician has requested that you have a lexiscan myoview. For further information please visit [email or web address] Please follow instruction sheet, as given. ? ?Your physician has requested that you have an echocardiogram. Echocardiography is a painless test that uses sound waves to create images of your heart. It provides your doctor with information about the size and shape of your heart and how well your heart?s chambers and valves are working. This procedure takes approximately one hour. There are no restrictions for this procedure. ? ? ?Follow-Up: ?1 year  ? ?Any Other Special Instructions Will Be Listed Below (If Applicable). ? ?If you need a refill on your cardiac medications before your next appointment, please call your pharmacy. ? ?

## 2021-12-11 NOTE — Progress Notes (Signed)
?Cardiology Office Note:   ? ?Date:  12/11/2021  ? ?ID:  Angela Wallace, DOB 1961-03-21, MRN [id number] ? ?PCP:  Elfredia Nevins, MD ?  ?[facility] Providers ?Cardiologist:  None { ? ? ?Referring MD: Avis Epley, PA*  ? ? ? ?History of Present Illness:   ? ?Angela Wallace is a 61 y.o. female with a hx of HTN, LBBB, migraines and prior CVA who was referred by Terie Purser, PA-C for pre-operative evaluation prior to left knee replacement.  ? ?Today, the patient states she overall feels well. Activity is limited due to knee pain but she is able to walk 1 flight of stairs without chest pain or significant dyspnea. She has not exercised in a while due to her arthritis but is hoping to get back to the gym following her procedure. Denies orthopnea, PND, lightheadedness, dizziness or syncope. No known personal history of CAD but has prior stroke. ? ?Notably, she has LBBB of unknown chronicity. Last ECG in our system was 2020 which also shows LBBB. Thinks she may have had one several years ago that may not have showed it but she is unsure. We do not have access to this.  ? ?Family history notable for father with CHF, mother with CHF and ESRD ? ?Past Medical History:  ?Diagnosis Date  ? Hypertension   ? Migraines   ? Stroke [facility])   ? ? ?Past Surgical History:  ?Procedure Laterality Date  ? ABDOMINAL HYSTERECTOMY    ? CYST EXCISION    ? ? ?Current Medications: ?Current Meds  ?Medication Sig  ? baclofen (LIORESAL) 10 MG tablet Take 1 tablet (10 mg total) by mouth 2 (two) times daily as needed (migraine headaches).  ? Bempedoic Acid (NEXLETOL) 180 MG TABS Take 180 mg by mouth at bedtime.  ? CALCIUM PO Take 1 tablet by mouth daily.  ? Cholecalciferol (VITAMIN D3 PO) Take 1 tablet by mouth daily.  ? clopidogrel (PLAVIX) 75 MG tablet Take 75 mg by mouth daily.  ? Coenzyme Q10 (COQ10) 200 MG CAPS Take 200 mg by mouth daily.   ? doxycycline (PERIOSTAT) 20 MG tablet Take 20 mg by mouth 2 (two) times daily.  ?  FLUoxetine (PROZAC) 40 MG capsule Take 40 mg by mouth daily.  ? gabapentin (NEURONTIN) 300 MG capsule Take 300 mg by mouth 2 (two) times daily. Takes 600  ? GLUCOSAMINE-CHONDROITIN PO Take 1 tablet by mouth 2 (two) times daily.  ? HYDROCHLOROTHIAZIDE PO Take 12.5 mg by mouth daily.  ? LORazepam (ATIVAN) 0.5 MG tablet Take by mouth every 8 (eight) hours. Dose unknown  ? losartan (COZAAR) 50 MG tablet Take 50 mg by mouth daily.  ? MAGNESIUM PO Take 1 tablet by mouth daily.  ? Omega-3 Fatty Acids (FISH OIL PO) Take 1 capsule by mouth 2 (two) times daily.  ? POTASSIUM PO Take 1 tablet by mouth daily.  ? propranolol ER (INDERAL LA) 60 MG 24 hr capsule Take 1 capsule (60 mg total) by mouth daily.  ? Rimegepant Sulfate (NURTEC) 75 MG TBDP Take 75 mg by mouth daily as needed (For migraine abortive therapy).  ? rosuvastatin (CRESTOR) 20 MG tablet TAKE ONE TABLET (20MG  TOTAL) BY MOUTH DAILY  ?  ? ?Allergies:   Chocolate; Citrus; Codeine; Lipitor [atorvastatin]; Nitrates, organic; Peanut-containing drug products; and Pineapple  ? ?Social History  ? ?Socioeconomic History  ? Marital status: Divorced  ?  Spouse name: Not on file  ? Number of children: Not on file  ?  Years of education: Not on file  ? Highest education level: Not on file  ?Occupational History  ? Not on file  ?Tobacco Use  ? Smoking status: Former  ?  Years: 5.00  ?  Types: Cigarettes  ? Smokeless tobacco: Never  ?Vaping Use  ? Vaping Use: Never used  ?Substance and Sexual Activity  ? Alcohol use: Yes  ?  Comment: occasional  ? Drug use: No  ? Sexual activity: Not on file  ?Other Topics Concern  ? Not on file  ?Social History Narrative  ? Not on file  ? ?Social Determinants of Health  ? ?Financial Resource Strain: Not on file  ?Food Insecurity: Not on file  ?Transportation Needs: Not on file  ?Physical Activity: Not on file  ?Stress: Not on file  ?Social Connections: Not on file  ?  ? ?Family History: ?The patient's family history includes Heart failure in her  father and mother; Hypertension in an other family member. ? ?ROS:   ?Please see the history of present illness.    ?Review of Systems  ?Constitutional:  Negative for malaise/fatigue.  ?Respiratory:  Negative for shortness of breath.   ?Cardiovascular:  Negative for chest pain, palpitations, orthopnea, claudication, leg swelling and PND.  ?Musculoskeletal:  Positive for joint pain and myalgias.  ?Neurological:  Negative for dizziness and loss of consciousness.   ? ?EKGs/Labs/Other Studies Reviewed:   ? ?The following studies were reviewed today: ?TTE 05/2019: ?IMPRESSIONS  ? ? ? 1. Left ventricular ejection fraction, by visual estimation, is 60 to  ?65%. The left ventricle has normal function. Left ventricular septal wall  ?thickness was mildly increased. Mildly increased left ventricular  ?posterior wall thickness. There is mildly  ?increased left ventricular hypertrophy.  ? 2. Elevated mean left atrial pressure.  ? 3. Left ventricular diastolic Doppler parameters are consistent with  ?impaired relaxation pattern of LV diastolic filling.  ? 4. Global right ventricle has normal systolic function.The right  ?ventricular size is normal. No increase in right ventricular wall  ?thickness.  ? 5. Left atrial size was normal.  ? 6. Right atrial size was normal.  ? 7. The mitral valve is normal in structure. No evidence of mitral valve  ?regurgitation. No evidence of mitral stenosis.  ? 8. The tricuspid valve is normal in structure. Tricuspid valve  ?regurgitation was not visualized by color flow Doppler.  ? 9. The aortic valve has an indeterminant number of cusps Aortic valve  ?regurgitation was not visualized by color flow Doppler. Structurally  ?normal aortic valve, with no evidence of sclerosis or stenosis.  ?10. The pulmonic valve was not well visualized. Pulmonic valve  ?regurgitation is not visualized by color flow Doppler.  ?11. Mildly dilated proximal ascending aorta 3.9 cm.  ? ?EKG:  EKG is  ordered today.  The  ekg ordered today demonstrates NSR with LBBB ? ?Recent Labs: ?01/02/2021: ALT 19; BUN 28; Creatinine, Ser 1.43; Potassium 4.0; Sodium 144  ?Recent Lipid Panel ?   ?Component Value Date/Time  ? CHOL 114 01/02/2021 0819  ? TRIG 155 (H) 01/02/2021 0819  ? HDL 38 (L) 01/02/2021 0819  ? CHOLHDL 3.0 01/02/2021 0819  ? CHOLHDL 5.1 05/27/2019 0532  ? VLDL 9 05/27/2019 0532  ? LDLCALC 50 01/02/2021 0819  ? ? ? ?Risk Assessment/Calculations:   ?  ? ?    ? ?Physical Exam:   ? ?VS:  BP 116/78   Pulse 67   Ht 5' 11.6" (1.819 m)   Wt 230  lb (104.3 kg)   BMI 31.54 kg/m?    ? ?Wt Readings from Last 3 Encounters:  ?12/11/21 230 lb (104.3 kg)  ?07/03/21 233 lb (105.7 kg)  ?01/02/21 227 lb (103 kg)  ?  ? ?GEN:  Well nourished, well developed in no acute distress ?HEENT: Normal ?NECK: No JVD; No carotid bruits ?CARDIAC: RRR, 2/6 systolic murmur. No rubs, gallops ?RESPIRATORY:  Clear to auscultation without rales, wheezing or rhonchi  ?ABDOMEN: Soft, non-tender, non-distended ?MUSCULOSKELETAL:  No edema; No deformity  ?SKIN: Warm and dry ?NEUROLOGIC:  Alert and oriented x 3 ?PSYCHIATRIC:  Normal affect  ? ?ASSESSMENT:   ? ?1. LBBB (left bundle branch block)   ?2. Pre-op evaluation   ?3. History of stroke   ?4. Primary hypertension   ?5. Mixed hyperlipidemia   ? ?PLAN:   ? ?In order of problems listed above: ? ?#Pre-operative Evaluation: ?#LBBB: ?Patient presents for pre-op evaluation prior to knee surgery. Activity is limited due to significant knee pain, however, she denies any anginal symptoms. While she is able to complete about , given LBBB of unknown chronicity, history of stroke and family history, will check TTE and myoview for further evaluation.  ?-Check TTE ?-Check myoview ? ?#History of CVA: ?Diagnosed in 2020. MRI with possible acute infarction along lateral right thalamus. TTE with normal BiV function and no significant valve disease. Was placed on plavix as her symptoms occurred while on ASA. Followed by Neurology  as outpatient. ?-Continue plavix 75mg  daily ?-Continue bempedoic acid 180mg  daily ?-Continue crestor 20mg  daily ? ?#HTN: ?Controlled. Managed by PCP. ?-Continue losartan 50mg  daily ?-Continue propranolol 60mg  daily

## 2021-12-16 ENCOUNTER — Encounter (HOSPITAL_COMMUNITY): Payer: No Typology Code available for payment source

## 2021-12-22 ENCOUNTER — Ambulatory Visit (HOSPITAL_COMMUNITY)
Admission: RE | Admit: 2021-12-22 | Discharge: 2021-12-22 | Disposition: A | Discharge status: Home / Self Care | Payer: No Typology Code available for payment source | Source: Ambulatory Visit | Attending: Cardiology | Admitting: Cardiology

## 2021-12-22 ENCOUNTER — Ambulatory Visit (HOSPITAL_BASED_OUTPATIENT_CLINIC_OR_DEPARTMENT_OTHER)
Admission: RE | Admit: 2021-12-22 | Discharge: 2021-12-22 | Disposition: A | Discharge status: Home / Self Care | Payer: No Typology Code available for payment source | Source: Ambulatory Visit | Attending: Cardiology | Admitting: Cardiology

## 2021-12-22 DIAGNOSIS — I447 Left bundle-branch block, unspecified: Secondary | ICD-10-CM

## 2021-12-22 DIAGNOSIS — Z01818 Encounter for other preprocedural examination: Secondary | ICD-10-CM | POA: Insufficient documentation

## 2021-12-22 LAB — NM MYOCAR MULTI W/SPECT W/WALL MOTION / EF
LV dias vol: 135 mL (ref 46–106)
LV sys vol: 53 mL
Nuc Stress EF: 60 %
Peak HR: 73 {beats}/min
RATE: 0.3
Rest HR: 52 {beats}/min
Rest Nuclear Isotope Dose: 9.1 mCi
SDS: 2
SRS: 2
SSS: 4
ST Depression (mm): 0 mm
Stress Nuclear Isotope Dose: 32 mCi
TID: 1.15

## 2021-12-22 LAB — ECHOCARDIOGRAM COMPLETE
Area-P 1/2: 1.83 cm2
S' Lateral: 3.3 cm

## 2021-12-22 MED ORDER — TECHNETIUM TC 99M TETROFOSMIN IV KIT
30.0000 | PACK | Freq: Once | INTRAVENOUS | Status: AC | PRN
  Administered 2021-12-22: 33 via INTRAVENOUS

## 2021-12-22 MED ORDER — REGADENOSON 0.4 MG/5ML IV SOLN
INTRAVENOUS | Status: AC
  Filled 2021-12-22: qty 5

## 2021-12-22 MED ORDER — REGADENOSON 0.4 MG/5ML IV SOLN
INTRAVENOUS | Status: AC
  Administered 2021-12-22: 0.4 mg
  Filled 2021-12-22: qty 5

## 2021-12-22 MED ORDER — TECHNETIUM TC 99M TETROFOSMIN IV KIT
10.0000 | PACK | Freq: Once | INTRAVENOUS | Status: AC | PRN
  Administered 2021-12-22: 9.08 via INTRAVENOUS

## 2021-12-22 MED ORDER — PERFLUTREN LIPID MICROSPHERE
1.0000 mL | INTRAVENOUS | Status: AC | PRN
  Administered 2021-12-22: 4 mL via INTRAVENOUS
  Filled 2021-12-22: qty 10

## 2021-12-22 MED ORDER — SODIUM CHLORIDE FLUSH 0.9 % IV SOLN
INTRAVENOUS | Status: AC
  Filled 2021-12-22: qty 10

## 2021-12-22 MED ORDER — SODIUM CHLORIDE FLUSH 0.9 % IV SOLN
INTRAVENOUS | Status: AC
  Administered 2021-12-22: 10 mL
  Filled 2021-12-22: qty 10

## 2021-12-22 NOTE — Progress Notes (Signed)
*  PRELIMINARY RESULTS* ?Echocardiogram ?2D Echocardiogram has been performed with Definity. ? ?Stacey Drain ?12/22/2021, 2:02 PM ?

## 2021-12-22 NOTE — Progress Notes (Signed)
I.V. was discontinued @ 1333 from the left antecubital with direct pressure and coban applied. Site was clean, dry and intact.  ? ? ?Delbert Phenix, RCS ?

## 2021-12-24 ENCOUNTER — Telehealth: Payer: Self-pay | Admitting: Cardiology

## 2021-12-24 NOTE — Telephone Encounter (Signed)
Results given to pt. And copy to PCP.  ?

## 2021-12-24 NOTE — Telephone Encounter (Signed)
Patient returned call for her results. Transferred to LPN.  ?

## 2021-12-31 ENCOUNTER — Encounter: Payer: Self-pay | Admitting: Adult Health

## 2021-12-31 ENCOUNTER — Telehealth: Payer: Self-pay

## 2021-12-31 ENCOUNTER — Ambulatory Visit: Payer: No Typology Code available for payment source | Admitting: Adult Health

## 2021-12-31 VITALS — BP 122/84 | HR 57 | Ht 71.5 in | Wt 230.0 lb

## 2021-12-31 DIAGNOSIS — I6381 Other cerebral infarction due to occlusion or stenosis of small artery: Secondary | ICD-10-CM | POA: Diagnosis not present

## 2021-12-31 DIAGNOSIS — G43109 Migraine with aura, not intractable, without status migrainosus: Secondary | ICD-10-CM

## 2021-12-31 MED ORDER — BACLOFEN 10 MG PO TABS: 10.0000 mg | ORAL_TABLET | Freq: Two times a day (BID) | ORAL | 11 refills | Status: DC | PRN

## 2021-12-31 MED ORDER — NURTEC 75 MG PO TBDP: 75.0000 mg | ORAL_TABLET | Freq: Every day | ORAL | 5 refills | Status: DC | PRN

## 2021-12-31 MED ORDER — PROPRANOLOL HCL ER 60 MG PO CP24: 60.0000 mg | ORAL_CAPSULE | Freq: Every day | ORAL | 3 refills | Status: DC

## 2021-12-31 NOTE — Telephone Encounter (Signed)
PA for nurtec has been sent. ? ?(Key: [id number])Need help? Call us at [phone] ?Status ?Sent to [employer] ?Next Steps ?The plan will fax you a determination, typically within 1 to 5 business days. ?[employer] developer Form ?Prior Authorization Form for [employer] Requests ?[phone] ?[phone] ?

## 2021-12-31 NOTE — Progress Notes (Signed)
?[facility] ?[id number] ?[place]. [place] [address] ?[phone] ? ?     STROKE FOLLOW UP NOTE ? ?Ms. [name] ?Date of Birth:  05-09-1961 ?Medical Record Number:  [id number]  ? ?Reason for visit: Stroke and migraine f/u ? ? ? ?CHIEF COMPLAINT:  ?Chief Complaint  ?Patient presents with  ? Follow-up  ?  Rm 2 here for 6 month f/u. Pt reports she has been doing ok. Nurtec samples helps with migraines. The baclofen is the most beneficial.   ? ? ? ?HPI:  ? ?Update 12/31/2021 JM: Patient returns for 9-month stroke and migraine follow-up.  Overall stable without new stroke/TIA symptoms.  Compliant on stroke prevention medications.  Blood pressure today 122/84.  Closely follows with PCP and cardiology. Reports about 2 severe migraines over the past 6 months- severe migraines can last up to 3 days and debilitating.  Has been having tension type headaches due to increased stressors (son dx'd with stage 3 colon cancer back in December, undergoing chemo currently, lives in [place] with child on the way due end of this month).  Remains on propranolol, denies side effects.  Use of Nurtec with benefit but baclofen seems to be more beneficial.  Reports nightly use of CPAP for apnea management. Recently cleared by cardiology to pursue left knee replacement - not yet scheduled.  No further concerns at this time. ? ? ? ?History provided for reference purposes only ?Update 07/03/2021 JM: Returns for 10-month stroke and migraine follow-up unaccompanied ? ?Stable from stroke standpoint -no new stroke/TIA symptoms ?Compliant on Plavix, Nexletol and Crestor -denies side effects ?Blood pressure today 143/72 ?Reports compliance on CPAP ? ?Migraines have greatly improved since starting propranolol after prior visit ?has had 2 migraines since prior visit but not as severe ?Bernita Raisin - will stop migraine but will return within 1-2 hrs  - has not tried to retake in 2 hours ?She did try baclofen for headache which greatly  helped - does not have active prescription  ? ?Continues to follow with psychiatry/psychology routinely  ? ?No further concerns at this time ? ?Update 01/02/2021 JM: Ms. Hamstra returns for 4-month stroke and migraine follow-up unaccompanied ? ?Has been stable from stroke standpoint without new stroke/TIA symptoms ?Reports compliance on Plavix, Nexletol and Crestor without associated side effects ?Blood pressure today 149/83 -occasionally monitors at home which has been stable ?Remains compliant on CPAP for OSA management ? ?Migraines have been relatively stable but she does note worsening around weather changes especially during the colder months as well as with increased stressors. She did try Nurtec but no benefit. She will use occasional advil and tylenol with some benefit. She continues to follow with pysch at [facility] for anxiety which continues to be beneficial. Remains on gabapentin 300mg  twice daily with some benefit of migraines and chronic cervical pain.   ? ?No further concerns at this time ? ?Update 07/04/2020 JM: Ms. Wilkowski returns for routine follow-up.  Stable since prior visit without new or worsening stroke/TIA symptoms.  She reports resolution of worsening left arm paresthesias since prior visit.  Underwent MRI which did not show new abnormality and EMG/NCV unremarkable.  She does have chronic cervical pain which has been relatively stable.  She will do dry needling as needed with great benefit.  Reports chronic migraines relatively stable experiencing approximately 3-4 migraine days per month.  Typically either left or right temporal pulsating pain associated with photophobia and phonophobia but denies recent nausea/vomiting.  She continues to  [place] with great benefit as well as improvement of migraines.  Recently they increased dose of gabapentin currently on 600 mg twice daily with improvement of cervical neck  pain and migraines.  Also remains on fluoxetine and lorazepam as needed for increased anxiety or migraine abortive. Remains on Plavix without bleeding or bruising.  Remains on Nexletol as well as Crestor which was initiated after prior visit for elevated LDL which she has remained on tolerating well without side effects.  Blood pressure today 144/77.  Monitors at home which has been stable.  Reports ongoing compliance with CPAP for OSA management.  No further concerns at this time. ? ?Update 03/17/2020 JM: Ms. Denton LankGriffith is being seen per patient request due to progressively worsening tingling of LUE over the past 5 weeks.  Previously seen for possible right thalamic stroke vs migraine with residual left hand middle fingertip numbness and over the past 5 weeks, now experiencing transient tingling whole hand and forearm.  Symptoms are transient with a vibratory/tingling sensation and can last for a couple of minutes and then subside and typically do not past the elbow.  Denies weakness, numbness, nerve pain, lower extremity symptoms or any other associated symptoms. Denies wrist, elbow, neck or shoulder pain. History of headaches/migraines which have been stable and his only experience 1 migraine over the past 4 months.  Reports increased stressors due to work issues and recent passing of her father which preceded the onset of symptoms.  Continues to follow with psychology/psychiatry with great benefit and ongoing use of fluoxetine and gabapentin but does admit to increased anxiety related to stressors. Remains on all prescribed medications including clopidogrel and Nexletol without side effects.  Has not had recent lab work.  Blood pressure today 149/82.  Endorses compliance of CPAP for sleep apnea management.  Denies recent medication changes.  Returns today for evaluation. ? ?Update 09/27/2019: Ms. Denton LankGriffith is a 61 year old female who is being seen today for follow-up.  Residual stroke deficits include very mild left  hand middle fingertip numbness but overall improving.  She has since established care with mood treatment center [place] with great improvement of her overall anxiety and stress.  She believes this is greatly impacted her headache/migraine frequency as she believes she had an actual migraine back in November but has not had any since that time.  She used Aimovig for the month of November but did not continue as she did not have any additional refills.  She was also found to have sleep apnea and has been using her CPAP machine for the past 3 months.  She continues on Plavix without bleeding or bruising.  Atorvastatin discontinued due to myalgias and placed on Nexletol by PCP.  Blood pressure today 142/88.  Denies new or worsening stroke/TIA symptoms. ? ?Initial visit 06/26/2019 JM: Ms. Denton LankGriffith is being seen today for hospital follow-up.  Residual stroke deficits mild left finger numbness which can worsen with temperature change but overall improving.  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  She does report high stress with her job position and increased stress when her headache started approximately 4 months ago.  History of migraines with occasional visual auras but has not experienced migraine headache in numerous years.  She was referred to headache wellness clinic and saw Dr. Neale BurlyFreeman where she received trigger point injections and trialed Robaxin, baclofen and Fioricet without much benefit.  She had previously been on  Topamax and beta-blockers for preventative migraine treatment but difficulty tolerating.  She has trialed abortive therapies in the past but had complications.  She is in the process of initiating Aimovig with Dr. Neale Burly but has not yet started.  Current headaches typically start with bilateral neck tension and then experience pain left temporal and frontal region.  These headaches will be associated with photophobia and phonophobia.  She is planning on undergoing HST  tomorrow which was ordered by her PCP to assess for potential sleep apnea.  She has completed 3 weeks DAPT and continues on Plavix alone without bleeding or bruising.  Continues on atorvastatin without

## 2021-12-31 NOTE — Patient Instructions (Addendum)
Your Plan: ? ?Continue current treatment regimen for migraines ? ?Continue close follow up with PCP for aggressive stroke risk factor management  ? ? ? ?Follow up in 1 year or call earlier if needed ? ? ? ? ?Thank you for coming to see [place] at [facility]. I hope we have been able to provide you high quality care today. ? ?You may receive a patient satisfaction survey over the next few weeks. We would appreciate your feedback and comments so that we may continue to improve ourselves and the health of our patients. ? ?

## 2022-01-01 ENCOUNTER — Encounter: Payer: Self-pay | Admitting: *Deleted

## 2022-01-01 NOTE — Telephone Encounter (Signed)
Additional information returned to [facility]; fax # [phone] confirmation received.  ?

## 2022-01-01 NOTE — Telephone Encounter (Signed)
Received fax asking for more clinical information. Placed on NP's desk for completion, signature.  ?

## 2022-01-01 NOTE — Telephone Encounter (Signed)
Nurtec approved until 01/01/2023. Sent patient my chart to inform her. ?

## 2022-02-19 ENCOUNTER — Ambulatory Visit: Payer: Self-pay | Admitting: Physician Assistant

## 2022-02-19 DIAGNOSIS — G8929 Other chronic pain: Secondary | ICD-10-CM

## 2022-02-19 NOTE — H&P (Cosign Needed)
TOTAL KNEE ADMISSION H&P  Patient is being admitted for left total knee arthroplasty.  Subjective:  Chief Complaint:left knee pain.  HPI: Angela Wallace, 61 y.o. female, has a history of pain and functional disability in the left knee due to arthritis and has failed non-surgical conservative treatments for greater than 12 weeks to includeNSAID's and/or analgesics, corticosteriod injections, and activity modification.  Onset of symptoms was gradual, starting >10 years ago with gradually worsening course since that time. The patient noted prior procedures on the knee to include  arthroscopy and menisectomy on the left knee(s).  Patient currently rates pain in the left knee(s) at 8 out of 10 with activity. Patient has night pain, worsening of pain with activity and weight bearing, pain that interferes with activities of daily living, pain with passive range of motion, crepitus, and joint swelling.  Patient has evidence of periarticular osteophytes and joint space narrowing by imaging studies. There is no active infection.  Patient Active Problem List   Diagnosis Date Noted   Acute CVA (cerebrovascular accident) (HCC) 05/27/2019   Hypokalemia 05/27/2019   LBBB (left bundle branch block) 05/27/2019   HTN (hypertension) 05/27/2019   Depression 05/27/2019   Past Medical History:  Diagnosis Date   Hypertension    Migraines    Stroke [facility])     Past Surgical History:  Procedure Laterality Date   ABDOMINAL HYSTERECTOMY     CYST EXCISION      Current Outpatient Medications  Medication Sig Dispense Refill Last Dose   baclofen (LIORESAL) 10 MG tablet Take 1 tablet (10 mg total) by mouth 2 (two) times daily as needed (migraine headaches). 30 each 11    Bempedoic Acid (NEXLETOL) 180 MG TABS Take 180 mg by mouth at bedtime.      CALCIUM PO Take 1 tablet by mouth daily.      Cholecalciferol (VITAMIN D3 PO) Take 1 tablet by mouth daily.      clopidogrel (PLAVIX) 75 MG tablet Take 75 mg by mouth  daily.      Coenzyme Q10 (COQ10) 200 MG CAPS Take 200 mg by mouth daily.       doxycycline (PERIOSTAT) 20 MG tablet Take 20 mg by mouth 2 (two) times daily.      FLUoxetine (PROZAC) 40 MG capsule Take 40 mg by mouth daily.      gabapentin (NEURONTIN) 300 MG capsule Take 300 mg by mouth 2 (two) times daily. Takes 600      GLUCOSAMINE-CHONDROITIN PO Take 1 tablet by mouth 2 (two) times daily.      HYDROCHLOROTHIAZIDE PO Take 12.5 mg by mouth daily.      LORazepam (ATIVAN) 0.5 MG tablet Take 0.5 mg by mouth as needed. Dose unknown      losartan (COZAAR) 50 MG tablet Take 50 mg by mouth daily.      MAGNESIUM PO Take 1 tablet by mouth daily.      Omega-3 Fatty Acids (FISH OIL PO) Take 1 capsule by mouth 2 (two) times daily.      POTASSIUM PO Take 1 tablet by mouth daily.      propranolol ER (INDERAL LA) 60 MG 24 hr capsule Take 1 capsule (60 mg total) by mouth daily. 90 capsule 3    Rimegepant Sulfate (NURTEC) 75 MG TBDP Take 75 mg by mouth daily as needed (For migraine abortive therapy). 10 tablet 5    rosuvastatin (CRESTOR) 20 MG tablet TAKE ONE TABLET (20MG  TOTAL) BY MOUTH DAILY 90 tablet 3  No current facility-administered medications for this visit.   Allergies  Allergen Reactions   Chocolate Other (See Comments)    Consuming a little more than usual can trigger a migraine   Citrus Other (See Comments)    Consuming a little more than usual can trigger a migraine   Codeine Other (See Comments)    Headache   Lipitor [Atorvastatin] Other (See Comments)    Joint pain   Nitrates, Organic Other (See Comments)    Consuming a little more than usual can trigger a migraine   Peanut-Containing Drug Products Other (See Comments)    Consuming a little more than usual can trigger a migraine   Pineapple Other (See Comments)    Consuming a little more than usual can trigger a migraine    Social History   Tobacco Use   Smoking status: Former    Years: 5.00    Types: Cigarettes   Smokeless  tobacco: Never  Substance Use Topics   Alcohol use: Yes    Comment: occasional    Family History  Problem Relation Age of Onset   Heart failure Mother    Heart failure Father    Hypertension Other      Review of Systems  HENT:  Positive for hearing loss.   Cardiovascular:  Positive for chest pain and leg swelling.  Genitourinary:  Positive for frequency.  Musculoskeletal:  Positive for arthralgias.  Neurological:  Positive for headaches.  Hematological:  Bruises/bleeds easily.  Psychiatric/Behavioral:  The patient is nervous/anxious.   All other systems reviewed and are negative.   Objective:  Physical Exam Constitutional:      General: She is not in acute distress.    Appearance: Normal appearance.  HENT:     Head: Normocephalic and atraumatic.  Eyes:     Extraocular Movements: Extraocular movements intact.     Pupils: Pupils are equal, round, and reactive to light.  Cardiovascular:     Rate and Rhythm: Normal rate and regular rhythm.     Pulses: Normal pulses.     Heart sounds: Normal heart sounds.  Pulmonary:     Effort: Pulmonary effort is normal. No respiratory distress.     Breath sounds: Normal breath sounds. No wheezing.  Abdominal:     General: Abdomen is flat. Bowel sounds are normal. There is no distension.     Palpations: Abdomen is soft.     Tenderness: There is no abdominal tenderness.  Musculoskeletal:     Cervical back: Normal range of motion and neck supple.     Comments: She ambulates with a mildly antalgic gait.  She has mild varus deformity of the left knee.  She has a mild left knee effusion.  Range of motion approximately 3-120 degrees.  Her knee is stable to varus and valgus stress.  Negative anterior and posterior drawer.  She has tenderness primarily to the medial joint line.  She has no pain with hip flexion or internal rotation.  She is non-tender over the greater trochanter.  She has 5/5 strength with bilateral hip flexion, knee extension,  ankle dorsiflexion and plantarflexion.  Sensation is intact distally below the knees.  Feet are warm and well perfused with a palpable DP pulse.  Lymphadenopathy:     Cervical: No cervical adenopathy.  Skin:    General: Skin is warm and dry.     Findings: No erythema or rash.  Neurological:     General: No focal deficit present.     Mental Status: She   is alert and oriented to person, place, and time.  Psychiatric:        Mood and Affect: Mood normal.        Behavior: Behavior normal.     Vital signs in last 24 hours: @VSRANGES @  Labs:   Estimated body mass index is 31.63 kg/m as calculated from the following:   Height as of 12/31/21: 5' 11.5" (1.816 m).   Weight as of 12/31/21: 104.3 kg.   Imaging Review Plain radiographs demonstrate moderate degenerative joint disease of the left knee(s). The overall alignment ismild varus. The bone quality appears to be good for age and reported activity level.      Assessment/Plan:  End stage arthritis, left knee   The patient history, physical examination, clinical judgment of the provider and imaging studies are consistent with end stage degenerative joint disease of the left knee(s) and total knee arthroplasty is deemed medically necessary. The treatment options including medical management, injection therapy arthroscopy and arthroplasty were discussed at length. The risks and benefits of total knee arthroplasty were presented and reviewed. The risks due to aseptic loosening, infection, stiffness, patella tracking problems, thromboembolic complications and other imponderables were discussed. The patient acknowledged the explanation, agreed to proceed with the plan and consent was signed. Patient is being admitted for inpatient treatment for surgery, pain control, PT, OT, prophylactic antibiotics, VTE prophylaxis, progressive ambulation and ADL's and discharge planning. The patient is planning to be discharged  home with outpt  PT.     Patient's anticipated LOS is less than 2 midnights, meeting these requirements: - Younger than 50 - Lives within 1 hour of care - Has a competent adult at home to recover with post-op recover - NO history of  - Chronic pain requiring opiods  - Diabetes  - Coronary Artery Disease  - Heart failure  - Heart attack  - Stroke  - DVT/VTE  - Cardiac arrhythmia  - Respiratory Failure/COPD  - Renal failure  - Anemia  - Advanced Liver disease

## 2022-02-19 NOTE — H&P (View-Only) (Signed)
TOTAL KNEE ADMISSION H&P  Patient is being admitted for left total knee arthroplasty.  Subjective:  Chief Complaint:left knee pain.  HPI: Angela CHAISSON, 61 y.o. female, has a history of pain and functional disability in the left knee due to arthritis and has failed non-surgical conservative treatments for greater than 12 weeks to includeNSAID's and/or analgesics, corticosteriod injections, and activity modification.  Onset of symptoms was gradual, starting >10 years ago with gradually worsening course since that time. The patient noted prior procedures on the knee to include  arthroscopy and menisectomy on the left knee(s).  Patient currently rates pain in the left knee(s) at 8 out of 10 with activity. Patient has night pain, worsening of pain with activity and weight bearing, pain that interferes with activities of daily living, pain with passive range of motion, crepitus, and joint swelling.  Patient has evidence of periarticular osteophytes and joint space narrowing by imaging studies. There is no active infection.  Patient Active Problem List   Diagnosis Date Noted   Acute CVA (cerebrovascular accident) (HCC) 05/27/2019   Hypokalemia 05/27/2019   LBBB (left bundle branch block) 05/27/2019   HTN (hypertension) 05/27/2019   Depression 05/27/2019   Past Medical History:  Diagnosis Date   Hypertension    Migraines    Stroke [facility])     Past Surgical History:  Procedure Laterality Date   ABDOMINAL HYSTERECTOMY     CYST EXCISION      Current Outpatient Medications  Medication Sig Dispense Refill Last Dose   baclofen (LIORESAL) 10 MG tablet Take 1 tablet (10 mg total) by mouth 2 (two) times daily as needed (migraine headaches). 30 each 11    Bempedoic Acid (NEXLETOL) 180 MG TABS Take 180 mg by mouth at bedtime.      CALCIUM PO Take 1 tablet by mouth daily.      Cholecalciferol (VITAMIN D3 PO) Take 1 tablet by mouth daily.      clopidogrel (PLAVIX) 75 MG tablet Take 75 mg by mouth  daily.      Coenzyme Q10 (COQ10) 200 MG CAPS Take 200 mg by mouth daily.       doxycycline (PERIOSTAT) 20 MG tablet Take 20 mg by mouth 2 (two) times daily.      FLUoxetine (PROZAC) 40 MG capsule Take 40 mg by mouth daily.      gabapentin (NEURONTIN) 300 MG capsule Take 300 mg by mouth 2 (two) times daily. Takes 600      GLUCOSAMINE-CHONDROITIN PO Take 1 tablet by mouth 2 (two) times daily.      HYDROCHLOROTHIAZIDE PO Take 12.5 mg by mouth daily.      LORazepam (ATIVAN) 0.5 MG tablet Take 0.5 mg by mouth as needed. Dose unknown      losartan (COZAAR) 50 MG tablet Take 50 mg by mouth daily.      MAGNESIUM PO Take 1 tablet by mouth daily.      Omega-3 Fatty Acids (FISH OIL PO) Take 1 capsule by mouth 2 (two) times daily.      POTASSIUM PO Take 1 tablet by mouth daily.      propranolol ER (INDERAL LA) 60 MG 24 hr capsule Take 1 capsule (60 mg total) by mouth daily. 90 capsule 3    Rimegepant Sulfate (NURTEC) 75 MG TBDP Take 75 mg by mouth daily as needed (For migraine abortive therapy). 10 tablet 5    rosuvastatin (CRESTOR) 20 MG tablet TAKE ONE TABLET (20MG  TOTAL) BY MOUTH DAILY 90 tablet 3  No current facility-administered medications for this visit.   Allergies  Allergen Reactions   Chocolate Other (See Comments)    Consuming a little more than usual can trigger a migraine   Citrus Other (See Comments)    Consuming a little more than usual can trigger a migraine   Codeine Other (See Comments)    Headache   Lipitor [Atorvastatin] Other (See Comments)    Joint pain   Nitrates, Organic Other (See Comments)    Consuming a little more than usual can trigger a migraine   Peanut-Containing Drug Products Other (See Comments)    Consuming a little more than usual can trigger a migraine   Pineapple Other (See Comments)    Consuming a little more than usual can trigger a migraine    Social History   Tobacco Use   Smoking status: Former    Years: 5.00    Types: Cigarettes   Smokeless  tobacco: Never  Substance Use Topics   Alcohol use: Yes    Comment: occasional    Family History  Problem Relation Age of Onset   Heart failure Mother    Heart failure Father    Hypertension Other      Review of Systems  HENT:  Positive for hearing loss.   Cardiovascular:  Positive for chest pain and leg swelling.  Genitourinary:  Positive for frequency.  Musculoskeletal:  Positive for arthralgias.  Neurological:  Positive for headaches.  Hematological:  Bruises/bleeds easily.  Psychiatric/Behavioral:  The patient is nervous/anxious.   All other systems reviewed and are negative.   Objective:  Physical Exam Constitutional:      General: She is not in acute distress.    Appearance: Normal appearance.  HENT:     Head: Normocephalic and atraumatic.  Eyes:     Extraocular Movements: Extraocular movements intact.     Pupils: Pupils are equal, round, and reactive to light.  Cardiovascular:     Rate and Rhythm: Normal rate and regular rhythm.     Pulses: Normal pulses.     Heart sounds: Normal heart sounds.  Pulmonary:     Effort: Pulmonary effort is normal. No respiratory distress.     Breath sounds: Normal breath sounds. No wheezing.  Abdominal:     General: Abdomen is flat. Bowel sounds are normal. There is no distension.     Palpations: Abdomen is soft.     Tenderness: There is no abdominal tenderness.  Musculoskeletal:     Cervical back: Normal range of motion and neck supple.     Comments: She ambulates with a mildly antalgic gait.  She has mild varus deformity of the left knee.  She has a mild left knee effusion.  Range of motion approximately 3-120 degrees.  Her knee is stable to varus and valgus stress.  Negative anterior and posterior drawer.  She has tenderness primarily to the medial joint line.  She has no pain with hip flexion or internal rotation.  She is non-tender over the greater trochanter.  She has 5/5 strength with bilateral hip flexion, knee extension,  ankle dorsiflexion and plantarflexion.  Sensation is intact distally below the knees.  Feet are warm and well perfused with a palpable DP pulse.  Lymphadenopathy:     Cervical: No cervical adenopathy.  Skin:    General: Skin is warm and dry.     Findings: No erythema or rash.  Neurological:     General: No focal deficit present.     Mental Status: She  is alert and oriented to person, place, and time.  Psychiatric:        Mood and Affect: Mood normal.        Behavior: Behavior normal.     Vital signs in last 24 hours: @VSRANGES @  Labs:   Estimated body mass index is 31.63 kg/m as calculated from the following:   Height as of 12/31/21: 5' 11.5" (1.816 m).   Weight as of 12/31/21: 104.3 kg.   Imaging Review Plain radiographs demonstrate moderate degenerative joint disease of the left knee(s). The overall alignment ismild varus. The bone quality appears to be good for age and reported activity level.      Assessment/Plan:  End stage arthritis, left knee   The patient history, physical examination, clinical judgment of the provider and imaging studies are consistent with end stage degenerative joint disease of the left knee(s) and total knee arthroplasty is deemed medically necessary. The treatment options including medical management, injection therapy arthroscopy and arthroplasty were discussed at length. The risks and benefits of total knee arthroplasty were presented and reviewed. The risks due to aseptic loosening, infection, stiffness, patella tracking problems, thromboembolic complications and other imponderables were discussed. The patient acknowledged the explanation, agreed to proceed with the plan and consent was signed. Patient is being admitted for inpatient treatment for surgery, pain control, PT, OT, prophylactic antibiotics, VTE prophylaxis, progressive ambulation and ADL's and discharge planning. The patient is planning to be discharged  home with outpt  PT.     Patient's anticipated LOS is less than 2 midnights, meeting these requirements: - Younger than 50 - Lives within 1 hour of care - Has a competent adult at home to recover with post-op recover - NO history of  - Chronic pain requiring opiods  - Diabetes  - Coronary Artery Disease  - Heart failure  - Heart attack  - Stroke  - DVT/VTE  - Cardiac arrhythmia  - Respiratory Failure/COPD  - Renal failure  - Anemia  - Advanced Liver disease

## 2022-02-19 NOTE — Progress Notes (Signed)
Sent message, via epic in basket, requesting order in epic from surgeon     02/19/22 0921  Preop Orders  Has preop orders? No  Name of staff/physician contacted for orders(Indicate phone or IB message) M. Aleen Campi, PA-C

## 2022-02-20 NOTE — Patient Instructions (Addendum)
DUE TO COVID-19 ONLY TWO VISITORS  (aged 63 and older)  IS ALLOWED TO COME WITH YOU AND STAY IN THE WAITING ROOM ONLY DURING PRE OP AND PROCEDURE.   **NO VISITORS ARE ALLOWED IN THE SHORT STAY AREA OR RECOVERY ROOM!!**  You are not required to quarantine Hand Hygiene often Do NOT share personal items Notify your provider if you are in close contact with someone who has COVID or you develop fever 100.4 or greater, new onset of sneezing, cough, sore throat, shortness of breath or body aches.        Your procedure is scheduled on:  03-11-22   Report to [facility] Main Entrance    Report to admitting at 6:30 AM   Call this number if you have problems the morning of surgery [phone]   Do not eat food :After Midnight the night before surgery   After Midnight you may have the following liquids until 5:30 AM DAY OF SURGERY  Clear Liquid Diet Water Black Coffee (sugar ok, NO MILK/CREAM OR CREAMERS)  Tea (sugar ok, NO MILK/CREAM OR CREAMERS) regular and decaf                             Plain Jell-O (NO RED)                                           Fruit ices (not with fruit pulp, NO RED)                                     Popsicles (NO RED)                                                                  Juice: apple, WHITE grape, WHITE cranberry Sports drinks like Gatorade (NO RED) Clear broth(vegetable,chicken,beef)                   The day of surgery:  Drink ONE (1) Pre-Surgery Clear Ensure at 5:30 AM the morning of surgery. Drink in one sitting. Do not sip.  This drink was given to you during your hospital  pre-op appointment visit. Nothing else to drink after completing the Pre-Surgery Clear Ensure           If you have questions, please contact your surgeon's office.   FOLLOW ANY ADDITIONAL PRE OP INSTRUCTIONS YOU RECEIVED FROM YOUR SURGEON'S OFFICE!!!     Oral Hygiene is also important to reduce your risk of infection.                                     Remember - BRUSH YOUR TEETH THE MORNING OF SURGERY WITH YOUR REGULAR TOOTHPASTE  Take these medicines the morning of surgery with A SIP OF WATER:  Fluoxetine, Gabapentin, Lorazepam, Propranolol, Rosuvastatin.  Okay to use Nurtec if needed   DO NOT BRING YOUR HOME MEDICATIONS TO THE HOSPITAL. PHARMACY WILL DISPENSE MEDICATIONS LISTED ON YOUR MEDICATION  LIST TO YOU DURING YOUR ADMISSION IN THE HOSPITAL!                              You may not have any metal on your body including hair pins, jewelry, and body piercing             Do not wear make-up, lotions, powders, perfumes or deodorant  Do not wear nail polish including gel and S&S, artificial/acrylic nails, or any other type of covering on natural nails including finger and toenails. If you have artificial nails, gel coating, etc. that needs to be removed by a nail salon please have this removed prior to surgery or surgery may need to be canceled/ delayed if the surgeon/ anesthesia feels like they are unable to be safely monitored.   Do not shave  48 hours prior to surgery.    Do not bring valuables to the hospital. [facility] IS NOT RESPONSIBLE   FOR VALUABLES.   Patients discharged on the day of surgery will not be allowed to drive home.  Someone NEEDS to stay with you for the first 24 hours after anesthesia.  Special Instructions: Bring a copy of your healthcare power of attorney and living will documents the day of surgery if you haven't scanned them before.  Please read over the following fact sheets you were given: IF YOU HAVE QUESTIONS ABOUT YOUR PRE-OP INSTRUCTIONS PLEASE CALL [phone] [facility] - Preparing for Surgery Before surgery, you can play an important role.  Because skin is not sterile, your skin needs to be as free of germs as possible.  You can reduce the number of germs on your skin by washing with CHG (chlorahexidine gluconate) soap before surgery.  CHG is an antiseptic cleaner which kills germs and  bonds with the skin to continue killing germs even after washing. Please DO NOT use if you have an allergy to CHG or antibacterial soaps.  If your skin becomes reddened/irritated stop using the CHG and inform your nurse when you arrive at Short Stay. Do not shave (including legs and underarms) for at least 48 hours prior to the first CHG shower.  You may shave your face/neck.  Please follow these instructions carefully:  1.  Shower with CHG Soap the night before surgery and the  morning of surgery.  2.  If you choose to wash your hair, wash your hair first as usual with your normal  shampoo.  3.  After you shampoo, rinse your hair and body thoroughly to remove the shampoo.                             4.  Use CHG as you would any other liquid soap.  You can apply chg directly to the skin and wash.  Gently with a scrungie or clean washcloth.  5.  Apply the CHG Soap to your body ONLY FROM THE NECK DOWN.   Do   not use on face/ open                           Wound or open sores. Avoid contact with eyes, ears mouth and   genitals (private parts).                       Wash face,  Genitals (private parts) with  your normal soap.             6.  Wash thoroughly, paying special attention to the area where your    surgery  will be performed.  7.  Thoroughly rinse your body with warm water from the neck down.  8.  DO NOT shower/wash with your normal soap after using and rinsing off the CHG Soap.                9.  Pat yourself dry with a clean towel.            10.  Wear clean pajamas.            11.  Place clean sheets on your bed the night of your first shower and do not  sleep with pets. Day of Surgery : Do not apply any lotions/deodorants the morning of surgery.  Please wear clean clothes to the hospital/surgery center.  FAILURE TO FOLLOW THESE INSTRUCTIONS MAY RESULT IN THE CANCELLATION OF YOUR SURGERY  PATIENT SIGNATURE_________________________________  NURSE  SIGNATURE__________________________________  ________________________________________________________________________     Angela Wallace  An incentive spirometer is a tool that can help keep your lungs clear and active. This tool measures how well you are filling your lungs with each breath. Taking long deep breaths may help reverse or decrease the chance of developing breathing (pulmonary) problems (especially infection) following: A long period of time when you are unable to move or be active. BEFORE THE PROCEDURE  If the spirometer includes an indicator to show your best effort, your nurse or respiratory therapist will set it to a desired goal. If possible, sit up straight or lean slightly forward. Try not to slouch. Hold the incentive spirometer in an upright position. INSTRUCTIONS FOR USE  Sit on the edge of your bed if possible, or sit up as far as you can in bed or on a chair. Hold the incentive spirometer in an upright position. Breathe out normally. Place the mouthpiece in your mouth and seal your lips tightly around it. Breathe in slowly and as deeply as possible, raising the piston or the ball toward the top of the column. Hold your breath for 3-5 seconds or for as long as possible. Allow the piston or ball to fall to the bottom of the column. Remove the mouthpiece from your mouth and breathe out normally. Rest for a few seconds and repeat Steps 1 through 7 at least 10 times every 1-2 hours when you are awake. Take your time and take a few normal breaths between deep breaths. The spirometer may include an indicator to show your best effort. Use the indicator as a goal to work toward during each repetition. After each set of 10 deep breaths, practice coughing to be sure your lungs are clear. If you have an incision (the cut made at the time of surgery), support your incision when coughing by placing a pillow or rolled up towels firmly against it. Once you are able to get out  of bed, walk around indoors and cough well. You may stop using the incentive spirometer when instructed by your caregiver.  RISKS AND COMPLICATIONS Take your time so you do not get dizzy or light-headed. If you are in pain, you may need to take or ask for pain medication before doing incentive spirometry. It is harder to take a deep breath if you are having pain. AFTER USE Rest and breathe slowly and easily. It can be helpful to keep track of a  log of your progress. Your caregiver can provide you with a simple table to help with this. If you are using the spirometer at home, follow these instructions: SEEK MEDICAL CARE IF:  You are having difficultly using the spirometer. You have trouble using the spirometer as often as instructed. Your pain medication is not giving enough relief while using the spirometer. You develop fever of 100.5 F (38.1 C) or higher. SEEK IMMEDIATE MEDICAL CARE IF:  You cough up bloody sputum that had not been present before. You develop fever of 102 F (38.9 C) or greater. You develop worsening pain at or near the incision site. MAKE SURE YOU:  Understand these instructions. Will watch your condition. Will get help right away if you are not doing well or get worse. Document Released: 12/28/2006 Document Revised: 11/09/2011 Document Reviewed: 02/28/2007 Exit[employer] Patient Information 2014 [employer], [place].   ________________________________________________________________________  WHAT IS A BLOOD TRANSFUSION? Blood Transfusion Information  A transfusion is the replacement of blood or some of its parts. Blood is made up of multiple cells which provide different functions. Red blood cells carry oxygen and are used for blood loss replacement. White blood cells fight against infection. Platelets control bleeding. Plasma helps clot blood. Other blood products are available for specialized needs, such as hemophilia or other clotting disorders. BEFORE THE TRANSFUSION   Who gives blood for transfusions?  Healthy volunteers who are fully evaluated to make sure their blood is safe. This is blood bank blood. Transfusion therapy is the safest it has ever been in the practice of medicine. Before blood is taken from a donor, a complete history is taken to make sure that person has no history of diseases nor engages in risky social behavior (examples are intravenous drug use or sexual activity with multiple partners). The donor's travel history is screened to minimize risk of transmitting infections, such as malaria. The donated blood is tested for signs of infectious diseases, such as HIV and hepatitis. The blood is then tested to be sure it is compatible with you in order to minimize the chance of a transfusion reaction. If you or a relative donates blood, this is often done in anticipation of surgery and is not appropriate for emergency situations. It takes many days to process the donated blood. RISKS AND COMPLICATIONS Although transfusion therapy is very safe and saves many lives, the main dangers of transfusion include:  Getting an infectious disease. Developing a transfusion reaction. This is an allergic reaction to something in the blood you were given. Every precaution is taken to prevent this. The decision to have a blood transfusion has been considered carefully by your caregiver before blood is given. Blood is not given unless the benefits outweigh the risks. AFTER THE TRANSFUSION Right after receiving a blood transfusion, you will usually feel much better and more energetic. This is especially true if your red blood cells have gotten low (anemic). The transfusion raises the level of the red blood cells which carry oxygen, and this usually causes an energy increase. The nurse administering the transfusion will monitor you carefully for complications. HOME CARE INSTRUCTIONS  No special instructions are needed after a transfusion. You may find your energy is  better. Speak with your caregiver about any limitations on activity for underlying diseases you may have. SEEK MEDICAL CARE IF:  Your condition is not improving after your transfusion. You develop redness or irritation at the intravenous (IV) site. SEEK IMMEDIATE MEDICAL CARE IF:  Any of the following symptoms occur over  the next 12 hours: Shaking chills. You have a temperature by mouth above 102 F (38.9 C), not controlled by medicine. Chest, back, or muscle pain. People around you feel you are not acting correctly or are confused. Shortness of breath or difficulty breathing. Dizziness and fainting. You get a rash or develop hives. You have a decrease in urine output. Your urine turns a dark color or changes to pink, red, or brown. Any of the following symptoms occur over the next 10 days: You have a temperature by mouth above 102 F (38.9 C), not controlled by medicine. Shortness of breath. Weakness after normal activity. The white part of the eye turns yellow (jaundice). You have a decrease in the amount of urine or are urinating less often. Your urine turns a dark color or changes to pink, red, or brown. Document Released: 08/14/2000 Document Revised: 11/09/2011 Document Reviewed: 04/02/2008 [facility] Patient Information 2014 [place], [place].  _______________________________________________________________________

## 2022-02-26 ENCOUNTER — Encounter (HOSPITAL_COMMUNITY)
Admission: RE | Admit: 2022-02-26 | Discharge: 2022-02-26 | Disposition: A | Discharge status: Home / Self Care | Payer: No Typology Code available for payment source | Source: Ambulatory Visit | Attending: Orthopedic Surgery | Admitting: Orthopedic Surgery

## 2022-02-26 ENCOUNTER — Encounter (HOSPITAL_COMMUNITY): Payer: Self-pay

## 2022-02-26 VITALS — Ht 72.0 in | Wt 225.0 lb

## 2022-02-26 DIAGNOSIS — Z01818 Encounter for other preprocedural examination: Secondary | ICD-10-CM

## 2022-02-26 HISTORY — DX: Respiratory tuberculosis unspecified: A15.9

## 2022-02-26 HISTORY — DX: Cardiac arrhythmia, unspecified: I49.9

## 2022-02-26 HISTORY — DX: Unspecified osteoarthritis, unspecified site: M19.90

## 2022-02-26 HISTORY — DX: Anxiety disorder, unspecified: F41.9

## 2022-02-26 HISTORY — DX: Personal history of urinary calculi: Z87.442

## 2022-02-26 HISTORY — DX: Pneumonia, unspecified organism: J18.9

## 2022-02-26 HISTORY — DX: Sleep apnea, unspecified: G47.30

## 2022-02-26 NOTE — Progress Notes (Signed)
Interview completed over the phone due to patient having a flight delay. She is coming in 03/02/22 for labs and to get instructions.   COVID Vaccine Completed: yes x2  Date of COVID positive in last 90 days: no  PCP - Elfredia Nevins, MD Cardiologist - Laurance Flatten, MD Neurologist- Ihor Austin, MD  Chest x-ray - n/a EKG - 12/11/21 Epic Stress Test - 12/22/21 Epic ECHO - 12/22/21 Epic Cardiac Cath - n/a Pacemaker/ICD device last checked: n/a Spinal Cord Stimulator: n/a  Bowel Prep - no  Sleep Study - yes, positive CPAP - yes, most nights per pt  Fasting Blood Sugar - n/a Checks Blood Sugar _____ times a day  Blood Thinner Instructions: Plavix, hold 7 days before surgery Aspirin Instructions: Last Dose: 03/03/22 0800  Activity level: Can go up a flight of stairs and perform activities of daily living without stopping and without symptoms of chest pain or shortness of breath.   Anesthesia review: LBBB, HTN, CVA  Patient denies shortness of breath, fever, cough and chest pain at PAT appointment  Patient verbalized understanding of instructions that were given to them at the PAT appointment. Patient was also instructed that they will need to review over the PAT instructions again at home before surgery.

## 2022-03-02 ENCOUNTER — Encounter (HOSPITAL_COMMUNITY)
Admission: RE | Admit: 2022-03-02 | Discharge: 2022-03-02 | Disposition: A | Discharge status: Home / Self Care | Payer: No Typology Code available for payment source | Source: Ambulatory Visit | Attending: Orthopedic Surgery | Admitting: Orthopedic Surgery

## 2022-03-02 DIAGNOSIS — M25562 Pain in left knee: Secondary | ICD-10-CM | POA: Diagnosis not present

## 2022-03-02 DIAGNOSIS — G8929 Other chronic pain: Secondary | ICD-10-CM | POA: Diagnosis not present

## 2022-03-02 DIAGNOSIS — Z01818 Encounter for other preprocedural examination: Secondary | ICD-10-CM

## 2022-03-02 DIAGNOSIS — Z01812 Encounter for preprocedural laboratory examination: Secondary | ICD-10-CM | POA: Insufficient documentation

## 2022-03-02 LAB — CBC WITH DIFFERENTIAL/PLATELET
Abs Immature Granulocytes: 0.01 10*3/uL (ref 0.00–0.07)
Basophils Absolute: 0 10*3/uL (ref 0.0–0.1)
Basophils Relative: 1 %
Eosinophils Absolute: 0.1 10*3/uL (ref 0.0–0.5)
Eosinophils Relative: 2 %
HCT: 38.8 % (ref 36.0–46.0)
Hemoglobin: 12.9 g/dL (ref 12.0–15.0)
Immature Granulocytes: 0 %
Lymphocytes Relative: 34 %
Lymphs Abs: 2 10*3/uL (ref 0.7–4.0)
MCH: 29.9 pg (ref 26.0–34.0)
MCHC: 33.2 g/dL (ref 30.0–36.0)
MCV: 90 fL (ref 80.0–100.0)
Monocytes Absolute: 0.3 10*3/uL (ref 0.1–1.0)
Monocytes Relative: 5 %
Neutro Abs: 3.6 10*3/uL (ref 1.7–7.7)
Neutrophils Relative %: 58 %
Platelets: 318 10*3/uL (ref 150–400)
RBC: 4.31 MIL/uL (ref 3.87–5.11)
RDW: 12.8 % (ref 11.5–15.5)
WBC: 6.1 10*3/uL (ref 4.0–10.5)
nRBC: 0 % (ref 0.0–0.2)

## 2022-03-02 LAB — COMPREHENSIVE METABOLIC PANEL
ALT: 22 U/L (ref 0–44)
AST: 21 U/L (ref 15–41)
Albumin: 4.1 g/dL (ref 3.5–5.0)
Alkaline Phosphatase: 70 U/L (ref 38–126)
Anion gap: 8 (ref 5–15)
BUN: 29 mg/dL — ABNORMAL HIGH (ref 8–23)
CO2: 25 mmol/L (ref 22–32)
Calcium: 9.9 mg/dL (ref 8.9–10.3)
Chloride: 109 mmol/L (ref 98–111)
Creatinine, Ser: 1.3 mg/dL — ABNORMAL HIGH (ref 0.44–1.00)
GFR, Estimated: 46 mL/min — ABNORMAL LOW (ref >60)
Glucose, Bld: 116 mg/dL — ABNORMAL HIGH (ref 70–99)
Potassium: 4.2 mmol/L (ref 3.5–5.1)
Sodium: 142 mmol/L (ref 135–145)
Total Bilirubin: 0.8 mg/dL (ref 0.3–1.2)
Total Protein: 7.1 g/dL (ref 6.5–8.1)

## 2022-03-02 LAB — SURGICAL PCR SCREEN
MRSA, PCR: NEGATIVE
Staphylococcus aureus: NEGATIVE

## 2022-03-04 NOTE — Progress Notes (Signed)
Anesthesia Chart Review   Case: [id number] Date/Time: 03/11/22 0815   Procedure: TOTAL KNEE ARTHROPLASTY (Left: Knee)   Anesthesia type: General   Pre-op diagnosis: OA LEFT KNEE   Location: [facility] ROOM 09 / [facility]   Surgeons: Joen Laura, MD       DISCUSSION:60 y.o. former smoker with h/o HTN, sleep apnea, stroke, LBBB, left knee OA scheduled for above procedure 03/11/2022 with Dr. Weber Cooks.   Pt seen by cardiology 12/11/2021 for preoperative evaluation.  Per OV note, "Patient presents for pre-op evaluation prior to knee surgery. Activity is limited due to significant knee pain, however, she denies any anginal symptoms. While she is able to complete about , given LBBB of unknown chronicity, history of stroke and family history, will check TTE and myoview for further evaluation. "  Echo 12/22/2021 with no valvular problems.   Stress Test 12/22/2021 Low risk stress test.  VS: Ht 6' (1.829 m)   Wt 102.1 kg   BMI 30.52 kg/m   PROVIDERS: Elfredia Nevins, MD is PCP   Laurance Flatten, MD is Cardiologist  LABS: Labs reviewed: Acceptable for surgery. (all labs ordered are listed, but only abnormal results are displayed)  Labs Reviewed - No data to display   IMAGES:   EKG: 12/11/2021 Rate 67 bpm  NSR LBBB  CV: Echo 12/22/2021  1. Left ventricular ejection fraction, by estimation, is 50%. The left  ventricle has low normal function. The left ventricle has no regional wall  motion abnormalities. The left ventricular internal cavity size was mildly  dilated. There is mild  asymmetric left ventricular hypertrophy of the basal-septal segment. Left  ventricular diastolic parameters are consistent with Grade II diastolic  dysfunction (pseudonormalization).   2. Right ventricular systolic function is normal. The right ventricular  size is normal. Tricuspid regurgitation signal is inadequate for assessing  PA pressure.   3. The mitral valve is grossly normal. Trivial  mitral valve  regurgitation. No evidence of mitral stenosis.   4. The aortic valve is tricuspid. Aortic valve regurgitation is not  visualized. No aortic stenosis is present.   5. The inferior vena cava is normal in size with greater than 50%  respiratory variability, suggesting right atrial pressure of 3 mmHg.  Past Medical History:  Diagnosis Date   Anxiety    Arthritis    Dysrhythmia    History of kidney stones    Hypertension    Migraines    Pneumonia    Sleep apnea    Stroke (HCC)    Tuberculosis    test positive, never had    Past Surgical History:  Procedure Laterality Date   ABDOMINAL HYSTERECTOMY     CYST EXCISION     left knee   KNEE ARTHROSCOPY Left    WISDOM TOOTH EXTRACTION      MEDICATIONS:  baclofen (LIORESAL) 10 MG tablet   Bempedoic Acid (NEXLETOL) 180 MG TABS   CALCIUM PO   Cholecalciferol (VITAMIN D3 PO)   clopidogrel (PLAVIX) 75 MG tablet   Coenzyme Q10 (COQ10) 200 MG CAPS   doxycycline (PERIOSTAT) 20 MG tablet   FLUoxetine (PROZAC) 40 MG capsule   gabapentin (NEURONTIN) 300 MG capsule   GLUCOSAMINE-CHONDROITIN PO   HYDROCHLOROTHIAZIDE PO   LORazepam (ATIVAN) 0.5 MG tablet   losartan (COZAAR) 50 MG tablet   MAGNESIUM PO   Omega-3 Fatty Acids (FISH OIL PO)   POTASSIUM PO   propranolol ER (INDERAL LA) 60 MG 24 hr capsule   Rimegepant Sulfate (NURTEC)  75 MG TBDP   rosuvastatin (CRESTOR) 20 MG tablet   No current facility-administered medications for this encounter.   Jodell Cipro Ward, PA-C [facility] Pre-Surgical Testing [phone]

## 2022-03-10 MED ORDER — TRANEXAMIC ACID 1000 MG/10ML IV SOLN
2000.0000 mg | INTRAVENOUS | Status: DC
  Filled 2022-03-10: qty 20

## 2022-03-11 ENCOUNTER — Encounter (HOSPITAL_COMMUNITY)
Admission: RE | Disposition: A | Discharge status: Home / Self Care | Payer: Self-pay | Source: Home / Self Care | Attending: Orthopedic Surgery

## 2022-03-11 ENCOUNTER — Ambulatory Visit (HOSPITAL_COMMUNITY)
Admission: RE | Admit: 2022-03-11 | Discharge: 2022-03-11 | Disposition: A | Discharge status: Home / Self Care | Payer: No Typology Code available for payment source | Attending: Orthopedic Surgery | Admitting: Orthopedic Surgery

## 2022-03-11 ENCOUNTER — Ambulatory Visit (HOSPITAL_COMMUNITY): Payer: No Typology Code available for payment source

## 2022-03-11 ENCOUNTER — Encounter (HOSPITAL_COMMUNITY): Payer: Self-pay | Admitting: Orthopedic Surgery

## 2022-03-11 ENCOUNTER — Ambulatory Visit (HOSPITAL_BASED_OUTPATIENT_CLINIC_OR_DEPARTMENT_OTHER): Payer: No Typology Code available for payment source | Admitting: Anesthesiology

## 2022-03-11 ENCOUNTER — Ambulatory Visit (HOSPITAL_COMMUNITY): Payer: No Typology Code available for payment source | Admitting: Physician Assistant

## 2022-03-11 ENCOUNTER — Other Ambulatory Visit: Payer: Self-pay

## 2022-03-11 DIAGNOSIS — I10 Essential (primary) hypertension: Secondary | ICD-10-CM

## 2022-03-11 DIAGNOSIS — G4730 Sleep apnea, unspecified: Secondary | ICD-10-CM | POA: Diagnosis not present

## 2022-03-11 DIAGNOSIS — M170 Bilateral primary osteoarthritis of knee: Secondary | ICD-10-CM | POA: Diagnosis present

## 2022-03-11 DIAGNOSIS — Z87891 Personal history of nicotine dependence: Secondary | ICD-10-CM

## 2022-03-11 DIAGNOSIS — M1712 Unilateral primary osteoarthritis, left knee: Secondary | ICD-10-CM

## 2022-03-11 HISTORY — PX: TOTAL KNEE ARTHROPLASTY: SHX125

## 2022-03-11 LAB — TYPE AND SCREEN
ABO/RH(D): O POS
Antibody Screen: NEGATIVE

## 2022-03-11 LAB — ABO/RH: ABO/RH(D): O POS

## 2022-03-11 SURGERY — ARTHROPLASTY, KNEE, TOTAL
Anesthesia: Spinal | Site: Knee | Laterality: Left

## 2022-03-11 MED ORDER — OXYCODONE HCL 5 MG PO TABS
5.0000 mg | ORAL_TABLET | Freq: Once | ORAL | Status: AC | PRN
  Administered 2022-03-11: 5 mg via ORAL

## 2022-03-11 MED ORDER — CLOPIDOGREL BISULFATE 75 MG PO TABS: 75.0000 mg | ORAL_TABLET | Freq: Every day | ORAL | Status: DC

## 2022-03-11 MED ORDER — SODIUM CHLORIDE (PF) 0.9 % IJ SOLN
INTRAMUSCULAR | Status: AC
  Filled 2022-03-11: qty 10

## 2022-03-11 MED ORDER — OXYCODONE HCL 5 MG/5ML PO SOLN: 5.0000 mg | Freq: Once | ORAL | Status: AC | PRN

## 2022-03-11 MED ORDER — ACETAMINOPHEN 500 MG PO TABS: 1000.0000 mg | ORAL_TABLET | Freq: Three times a day (TID) | ORAL | 0 refills | Status: AC | PRN

## 2022-03-11 MED ORDER — ISOPROPYL ALCOHOL 70 % SOLN
Status: AC
  Filled 2022-03-11: qty 480

## 2022-03-11 MED ORDER — ASPIRIN 81 MG PO TBEC: 81.0000 mg | DELAYED_RELEASE_TABLET | Freq: Two times a day (BID) | ORAL | 0 refills | Status: AC

## 2022-03-11 MED ORDER — SODIUM CHLORIDE 0.9 % IV SOLN: INTRAVENOUS | Status: DC

## 2022-03-11 MED ORDER — SODIUM CHLORIDE 0.9 % IV SOLN
INTRAVENOUS | Status: DC | PRN
  Administered 2022-03-11: 80 mL

## 2022-03-11 MED ORDER — FENTANYL CITRATE (PF) 100 MCG/2ML IJ SOLN
INTRAMUSCULAR | Status: AC
  Filled 2022-03-11: qty 2

## 2022-03-11 MED ORDER — ONDANSETRON HCL 4 MG/2ML IJ SOLN
4.0000 mg | Freq: Once | INTRAMUSCULAR | Status: DC | PRN
Start: 2022-03-11 — End: 2022-03-11

## 2022-03-11 MED ORDER — OXYCODONE HCL 5 MG PO TABS
ORAL_TABLET | ORAL | Status: AC
  Filled 2022-03-11: qty 1

## 2022-03-11 MED ORDER — FENTANYL CITRATE (PF) 250 MCG/5ML IJ SOLN
INTRAMUSCULAR | Status: DC | PRN
  Administered 2022-03-11 (×2): 50 ug via INTRAVENOUS

## 2022-03-11 MED ORDER — LACTATED RINGERS IV SOLN: INTRAVENOUS | Status: DC

## 2022-03-11 MED ORDER — OXYCODONE HCL 5 MG PO TABS: 5.0000 mg | ORAL_TABLET | ORAL | 0 refills | Status: AC | PRN

## 2022-03-11 MED ORDER — SODIUM CHLORIDE 0.9 % IR SOLN
Status: DC | PRN
  Administered 2022-03-11: 3000 mL

## 2022-03-11 MED ORDER — POVIDONE-IODINE 10 % EX SWAB
2.0000 | Freq: Once | CUTANEOUS | Status: AC
  Administered 2022-03-11: 2 via TOPICAL

## 2022-03-11 MED ORDER — EPHEDRINE SULFATE-NACL 50-0.9 MG/10ML-% IV SOSY
PREFILLED_SYRINGE | INTRAVENOUS | Status: DC | PRN
  Administered 2022-03-11 (×2): 5 mg via INTRAVENOUS
  Administered 2022-03-11: 10 mg via INTRAVENOUS

## 2022-03-11 MED ORDER — ISOPROPYL ALCOHOL 70 % SOLN
Status: DC | PRN
  Administered 2022-03-11: 1 via TOPICAL

## 2022-03-11 MED ORDER — HYDROMORPHONE HCL 1 MG/ML IJ SOLN
INTRAMUSCULAR | Status: AC
  Filled 2022-03-11: qty 1

## 2022-03-11 MED ORDER — ACETAMINOPHEN 10 MG/ML IV SOLN: 1000.0000 mg | Freq: Once | INTRAVENOUS | Status: DC | PRN

## 2022-03-11 MED ORDER — METHOCARBAMOL 500 MG IVPB - SIMPLE MED
500.0000 mg | Freq: Four times a day (QID) | INTRAVENOUS | Status: DC | PRN
  Administered 2022-03-11: 500 mg via INTRAVENOUS

## 2022-03-11 MED ORDER — CEFAZOLIN SODIUM-DEXTROSE 2-4 GM/100ML-% IV SOLN: 2.0000 g | Freq: Four times a day (QID) | INTRAVENOUS | Status: DC

## 2022-03-11 MED ORDER — ONDANSETRON HCL 4 MG PO TABS: 4.0000 mg | ORAL_TABLET | Freq: Three times a day (TID) | ORAL | 0 refills | Status: AC | PRN

## 2022-03-11 MED ORDER — BUPIVACAINE LIPOSOME 1.3 % IJ SUSP: 20.0000 mL | Freq: Once | INTRAMUSCULAR | Status: DC

## 2022-03-11 MED ORDER — LACTATED RINGERS IV BOLUS
250.0000 mL | Freq: Once | INTRAVENOUS | Status: AC
  Administered 2022-03-11: 250 mL via INTRAVENOUS

## 2022-03-11 MED ORDER — MIDAZOLAM HCL 2 MG/2ML IJ SOLN
INTRAMUSCULAR | Status: AC
  Filled 2022-03-11: qty 2

## 2022-03-11 MED ORDER — LACTATED RINGERS IV BOLUS
500.0000 mL | Freq: Once | INTRAVENOUS | Status: AC
  Administered 2022-03-11: 500 mL via INTRAVENOUS

## 2022-03-11 MED ORDER — ORAL CARE MOUTH RINSE: 15.0000 mL | Freq: Once | OROMUCOSAL | Status: AC

## 2022-03-11 MED ORDER — ROPIVACAINE HCL 5 MG/ML IJ SOLN
INTRAMUSCULAR | Status: DC | PRN
  Administered 2022-03-11: 20 mL via PERINEURAL

## 2022-03-11 MED ORDER — BUPIVACAINE IN DEXTROSE 0.75-8.25 % IT SOLN
INTRATHECAL | Status: DC | PRN
  Administered 2022-03-11: 1.6 mL via INTRATHECAL

## 2022-03-11 MED ORDER — MIDAZOLAM HCL 5 MG/5ML IJ SOLN
INTRAMUSCULAR | Status: DC | PRN
  Administered 2022-03-11: 2 mg via INTRAVENOUS

## 2022-03-11 MED ORDER — PROPOFOL 10 MG/ML IV BOLUS
INTRAVENOUS | Status: DC | PRN
  Administered 2022-03-11: 20 ug via INTRAVENOUS

## 2022-03-11 MED ORDER — METHOCARBAMOL 500 MG PO TABS: 500.0000 mg | ORAL_TABLET | Freq: Four times a day (QID) | ORAL | Status: DC | PRN

## 2022-03-11 MED ORDER — TRANEXAMIC ACID-NACL 1000-0.7 MG/100ML-% IV SOLN
1000.0000 mg | INTRAVENOUS | Status: AC
  Administered 2022-03-11: 1000 mg via INTRAVENOUS
  Filled 2022-03-11: qty 100

## 2022-03-11 MED ORDER — BUPIVACAINE LIPOSOME 1.3 % IJ SUSP
INTRAMUSCULAR | Status: AC
  Filled 2022-03-11: qty 20

## 2022-03-11 MED ORDER — HYDROMORPHONE HCL 1 MG/ML IJ SOLN
0.2500 mg | INTRAMUSCULAR | Status: DC | PRN
  Administered 2022-03-11: 0.5 mg via INTRAVENOUS

## 2022-03-11 MED ORDER — CHLORHEXIDINE GLUCONATE 0.12 % MT SOLN
15.0000 mL | Freq: Once | OROMUCOSAL | Status: AC
  Administered 2022-03-11: 15 mL via OROMUCOSAL

## 2022-03-11 MED ORDER — PROPOFOL 500 MG/50ML IV EMUL
INTRAVENOUS | Status: DC | PRN
  Administered 2022-03-11: 100 ug/kg/min via INTRAVENOUS

## 2022-03-11 MED ORDER — WATER FOR IRRIGATION, STERILE IR SOLN
Status: DC | PRN
  Administered 2022-03-11: 2000 mL

## 2022-03-11 MED ORDER — 0.9 % SODIUM CHLORIDE (POUR BTL) OPTIME
TOPICAL | Status: DC | PRN
  Administered 2022-03-11: 1000 mL

## 2022-03-11 MED ORDER — SODIUM CHLORIDE (PF) 0.9 % IJ SOLN
INTRAMUSCULAR | Status: AC
  Filled 2022-03-11: qty 50

## 2022-03-11 MED ORDER — DEXAMETHASONE SODIUM PHOSPHATE 10 MG/ML IJ SOLN
8.0000 mg | Freq: Once | INTRAMUSCULAR | Status: AC
  Administered 2022-03-11: 8 mg via INTRAVENOUS

## 2022-03-11 MED ORDER — METHOCARBAMOL 500 MG IVPB - SIMPLE MED
INTRAVENOUS | Status: AC
  Filled 2022-03-11: qty 55

## 2022-03-11 MED ORDER — CEFAZOLIN SODIUM-DEXTROSE 2-4 GM/100ML-% IV SOLN
2.0000 g | INTRAVENOUS | Status: AC
  Administered 2022-03-11: 2 g via INTRAVENOUS
  Filled 2022-03-11: qty 100

## 2022-03-11 SURGICAL SUPPLY — 57 items
BAG COUNTER SPONGE SURGICOUNT (BAG) IMPLANT
BLADE SAG 18X100X1.27 (BLADE) ×2 IMPLANT
BLADE SAW SAG 35X64 .89 (BLADE) ×2 IMPLANT
BNDG COHESIVE 3X5 TAN ST LF (GAUZE/BANDAGES/DRESSINGS) ×2 IMPLANT
BNDG ELASTIC 6X10 VLCR STRL LF (GAUZE/BANDAGES/DRESSINGS) ×2 IMPLANT
BOWL SMART MIX CTS (DISPOSABLE) ×2 IMPLANT
CEMENT BONE R 1X40 (Cement) ×2 IMPLANT
CHLORAPREP W/TINT 26 (MISCELLANEOUS) ×4 IMPLANT
COMPONENT KNEE FEM SZ9 LT (Knees) ×1 IMPLANT
COVER SURGICAL LIGHT HANDLE (MISCELLANEOUS) ×2 IMPLANT
CUFF TOURN SGL QUICK 34 (TOURNIQUET CUFF) ×2
CUFF TRNQT CYL 34X4.125X (TOURNIQUET CUFF) ×1 IMPLANT
DERMABOND ADVANCED (GAUZE/BANDAGES/DRESSINGS) ×1
DERMABOND ADVANCED .7 DNX12 (GAUZE/BANDAGES/DRESSINGS) ×1 IMPLANT
DRAPE INCISE IOBAN 85X60 (DRAPES) ×2 IMPLANT
DRAPE SHEET LG 3/4 BI-LAMINATE (DRAPES) ×2 IMPLANT
DRAPE U-SHAPE 47X51 STRL (DRAPES) ×2 IMPLANT
DRESSING AQUACEL AG SP 3.5X10 (GAUZE/BANDAGES/DRESSINGS) ×1 IMPLANT
DRSG AQUACEL AG SP 3.5X10 (GAUZE/BANDAGES/DRESSINGS) ×2
ELECT REM PT RETURN 15FT ADLT (MISCELLANEOUS) ×2 IMPLANT
GAUZE SPONGE 4X4 12PLY STRL (GAUZE/BANDAGES/DRESSINGS) ×2 IMPLANT
GLOVE BIOGEL PI IND STRL 8 (GLOVE) ×1 IMPLANT
GLOVE BIOGEL PI INDICATOR 8 (GLOVE) ×1
GLOVE SURG ORTHO 8.0 STRL STRW (GLOVE) ×4 IMPLANT
GOWN STRL REUS W/ TWL XL LVL3 (GOWN DISPOSABLE) ×1 IMPLANT
GOWN STRL REUS W/TWL XL LVL3 (GOWN DISPOSABLE) ×2
HANDPIECE INTERPULSE COAX TIP (DISPOSABLE) ×2
HDLS TROCR DRIL PIN KNEE 75 (PIN) ×2
HOLDER FOLEY CATH W/STRAP (MISCELLANEOUS) ×2 IMPLANT
HOOD PEEL AWAY FLYTE STAYCOOL (MISCELLANEOUS) ×6 IMPLANT
MANIFOLD NEPTUNE II (INSTRUMENTS) ×2 IMPLANT
MARKER SKIN DUAL TIP RULER LAB (MISCELLANEOUS) ×2 IMPLANT
NS IRRIG 1000ML POUR BTL (IV SOLUTION) ×2 IMPLANT
PACK TOTAL KNEE CUSTOM (KITS) ×2 IMPLANT
PIN DRILL HDLS TROCAR 75 4PK (PIN) IMPLANT
PROTECTOR NERVE ULNAR (MISCELLANEOUS) ×2 IMPLANT
SCREW HEADED 33MM KNEE (MISCELLANEOUS) ×1 IMPLANT
SET HNDPC FAN SPRY TIP SCT (DISPOSABLE) ×1 IMPLANT
SOLUTION IRRIG SURGIPHOR (IV SOLUTION) IMPLANT
SPIKE FLUID TRANSFER (MISCELLANEOUS) ×2 IMPLANT
STEM POLY PAT PLY 35M KNEE (Knees) ×1 IMPLANT
STEM TIBIA 5 DEG SZ E L KNEE (Knees) IMPLANT
STEM TIBIAL 10 8-11 EF POLY LT (Joint) ×1 IMPLANT
STRIP CLOSURE SKIN 1/2X4 (GAUZE/BANDAGES/DRESSINGS) ×2 IMPLANT
SUT MNCRL AB 3-0 PS2 18 (SUTURE) ×2 IMPLANT
SUT STRATAFIX 0 PDS 27 VIOLET (SUTURE) ×2
SUT STRATAFIX PDO 1 14 VIOLET (SUTURE) ×2
SUT STRATFX PDO 1 14 VIOLET (SUTURE) ×1
SUT VIC AB 2-0 CT2 27 (SUTURE) ×4 IMPLANT
SUTURE STRATFX 0 PDS 27 VIOLET (SUTURE) ×1 IMPLANT
SUTURE STRATFX PDO 1 14 VIOLET (SUTURE) ×1 IMPLANT
SYR 50ML LL SCALE MARK (SYRINGE) ×2 IMPLANT
TIBIA STEM 5 DEG SZ E L KNEE (Knees) ×2 IMPLANT
TRAY FOLEY MTR SLVR 14FR STAT (SET/KITS/TRAYS/PACK) IMPLANT
TUBE SUCTION HIGH CAP CLEAR NV (SUCTIONS) ×2 IMPLANT
UNDERPAD 30X36 HEAVY ABSORB (UNDERPADS AND DIAPERS) ×2 IMPLANT
WRAP KNEE MAXI GEL POST OP (GAUZE/BANDAGES/DRESSINGS) ×1 IMPLANT

## 2022-03-11 NOTE — Transfer of Care (Signed)
Immediate Anesthesia Transfer of Care Note  Patient: Angela Wallace  Procedure(s) Performed: TOTAL KNEE ARTHROPLASTY (Left: Knee)  Patient Location: PACU  Anesthesia Type:Regional and Spinal  Level of Consciousness: awake and alert   Airway & Oxygen Therapy: Patient Spontanous Breathing and Patient connected to face mask oxygen  Post-op Assessment: Report given to RN and Post -op Vital signs reviewed and stable  Post vital signs: Reviewed and stable  Last Vitals:  Vitals Value Taken Time  BP    Temp    Pulse 51 03/11/22 1025  Resp 15 03/11/22 1025  SpO2 100 % 03/11/22 1025  Vitals shown include unvalidated device data.  Last Pain:  Vitals:   03/11/22 0707  TempSrc:   PainSc: 0-No pain         Complications: No notable events documented.

## 2022-03-11 NOTE — Anesthesia Preprocedure Evaluation (Signed)
Anesthesia Evaluation  Patient identified by MRN, date of birth, ID band Patient awake    Reviewed: Allergy & Precautions, NPO status , Patient's Chart, lab work & pertinent test results  Airway Mallampati: II  TM Distance: >3 FB Neck ROM: Full    Dental no notable dental hx.    Pulmonary sleep apnea , former smoker,    Pulmonary exam normal breath sounds clear to auscultation       Cardiovascular hypertension, Pt. on medications Normal cardiovascular exam Rhythm:Regular Rate:Normal     Neuro/Psych CVA negative psych ROS   GI/Hepatic negative GI ROS, Neg liver ROS,   Endo/Other  negative endocrine ROS  Renal/GU negative Renal ROS  negative genitourinary   Musculoskeletal negative musculoskeletal ROS (+)   Abdominal   Peds negative pediatric ROS (+)  Hematology negative hematology ROS (+)   Anesthesia Other Findings   Reproductive/Obstetrics negative OB ROS                             Anesthesia Physical Anesthesia Plan  ASA: 3  Anesthesia Plan: Spinal   Post-op Pain Management: Regional block*   Induction: Intravenous  PONV Risk Score and Plan: 2 and Propofol infusion, Treatment may vary due to age or medical condition and Ondansetron  Airway Management Planned: Simple Face Mask  Additional Equipment:   Intra-op Plan:   Post-operative Plan:   Informed Consent: I have reviewed the patients History and Physical, chart, labs and discussed the procedure including the risks, benefits and alternatives for the proposed anesthesia with the patient or authorized representative who has indicated his/her understanding and acceptance.     Dental advisory given  Plan Discussed with: CRNA and Surgeon  Anesthesia Plan Comments:         Anesthesia Quick Evaluation

## 2022-03-11 NOTE — Anesthesia Procedure Notes (Signed)
Anesthesia Regional Block: Adductor canal block   Pre-Anesthetic Checklist: , timeout performed,  Correct Patient, Correct Site, Correct Laterality,  Correct Procedure, Correct Position, site marked,  Risks and benefits discussed,  Surgical consent,  Pre-op evaluation,  At surgeon's request and post-op pain management  Laterality: Left  Prep: chloraprep       Needles:  Injection technique: Single-shot  Needle Type: Echogenic Needle     Needle Length: 9cm      Additional Needles:   Procedures:,,,, ultrasound used (permanent image in chart),,    Narrative:  Start time: 03/11/2022 7:52 AM End time: 03/11/2022 7:58 AM Injection made incrementally with aspirations every 5 mL.  Performed by: Personally  Anesthesiologist: Eilene Ghazi, MD  Additional Notes: Patient tolerated the procedure well without complications

## 2022-03-11 NOTE — Progress Notes (Signed)
Orthopedic Tech Progress Note Patient Details:  Angela Wallace Jul 03, 1961 [id number] Bone Foam was given to patient's nurse  Ortho Devices Type of Ortho Device: Bone foam zero knee Ortho Device/Splint Interventions: Ordered      [name] 03/11/2022, 1:24 PM

## 2022-03-11 NOTE — Discharge Instructions (Signed)

## 2022-03-11 NOTE — Anesthesia Procedure Notes (Signed)
Spinal  Patient location during procedure: OR Start time: 03/11/2022 7:26 AM End time: 03/11/2022 7:30 AM Reason for block: surgical anesthesia Staffing Performed: anesthesiologist  Anesthesiologist: Eilene Ghazi, MD Performed by: Eilene Ghazi, MD Authorized by: Eilene Ghazi, MD   Preanesthetic Checklist Completed: patient identified, IV checked, site marked, risks and benefits discussed, surgical consent, monitors and equipment checked, pre-op evaluation and timeout performed Spinal Block Patient position: sitting Prep: Betadine Patient monitoring: heart rate, continuous pulse ox and blood pressure Approach: midline Location: L3-4 Injection technique: single-shot Needle Needle type: Sprotte  Needle gauge: 24 G Needle length: 9 cm Assessment Sensory level: T6 Events: CSF return Additional Notes

## 2022-03-11 NOTE — Op Note (Signed)
DATE OF SURGERY:  03/11/2022 TIME: 10:16 AM  PATIENT NAME:  Angela Wallace   AGE: 61 y.o.    PRE-OPERATIVE DIAGNOSIS: End-stage left knee osteoarthritis  POST-OPERATIVE DIAGNOSIS:  Same  PROCEDURE: Left total Knee Arthroplasty  SURGEON:  Yasaman Kolek A Nyoka Alcoser, MD   ASSISTANT: Darron Doom, RNFA, present and scrubbed throughout the case, critical for assistance with exposure, retraction, instrumentation, and closure.   OPERATIVE IMPLANTS:  Cemented Zimmer persona femur size 9 left narrow, E tibia, 35 mm patella, 10 mm MC poly Implant Name Type Inv. Item Serial No. Manufacturer Lot No. LRB No. Used Action  COMPONENT KNEE FEM SZ9 LT - [id number] Knees COMPONENT KNEE FEM SZ9 LT  [employer](ORTH,TRAU,BIO,SG) [id number] Left 1 Implanted  TIBIA STEM 5 DEG SZ E L KNEE - [id number] Knees TIBIA STEM 5 DEG SZ E L KNEE  [employer]ON(ORTH,TRAU,BIO,SG) [id number] Left 1 Implanted  STEM POLY PAT PLY 53M KNEE - [id number] Knees STEM POLY PAT PLY 53M KNEE  ZIMMER [employer]ON(ORTH,TRAU,BIO,SG) [id number] Left 1 Implanted  CEMENT BONE R 1X40 - [id number] Cement CEMENT BONE R 1X40  [employer](ORTH,TRAU,BIO,SG) [id number] Left 2 Implanted  STEM TIBIAL 10 8-11 EF POLY LT - [id number] Joint STEM TIBIAL 10 8-11 EF POLY LT  Z[employer]MER [employer]ON(ORTH,TRAU,BIO,SG) [id number] Left 1 Implanted      PREOPERATIVE INDICATIONS:  Angela Wallace is a 61 y.o. year old female with end stage bone on bone degenerative arthritis of the knee who failed conservative treatment, including injections, antiinflammatories, activity modification, and assistive devices, and had significant impairment of their activities of daily living, and elected for Total Knee Arthroplasty.   The risks, benefits, and alternatives were discussed at length including but not limited to the risks of infection, bleeding, nerve injury, stiffness, blood clots, the need for revision surgery, cardiopulmonary complications, among others, and they were willing to  proceed.  ESTIMATED BLOOD LOSS: 50cc  OPERATIVE DESCRIPTION:   Once adequate anesthesia was induced, preoperative antibiotics, 2 gm of ancef,1 gm of Tranexamic Acid, and 8 mg of Decadron administered, the patient was positioned supine with a left thigh tourniquet placed.  The left lower extremity was prepped and draped in sterile fashion.  A time-  out was performed identifying the patient, planned procedure, and the appropriate extremity.     The leg was  exsanguinated, tourniquet elevated to 250 mmHg.  A midline incision was  made followed by median parapatellar arthrotomy. Anterior horn of the medial meniscus was released and resected. A medial release was performed, the infrapatellar fat pad was resected with care taken to protect the patellar tendon. The suprapatellar fat was removed to exposed the distal anterior femur. The anterior horn of the lateral meniscus and ACL were released.    Following initial  exposure, attention was first to the femur.  The femoral  canal was opened with a drill, canal was suctioned to try to prevent fat emboli.  An  intramedullary rod was passed set at 5 degrees valgus, 10 mm. The distal femur was resected.  Following this resection, the tibia was  subluxated anteriorly.  Using the extramedullary guide, 10 mm of bone was resected off   the proximal lateral tibia.  We confirmed the gap would be  stable medially and laterally with a size 54mm spacer block as well as confirmed that the tibial cut was perpendicular in the coronal plane, checking with an alignment rod.    Once this was done, the posterior femoral referencing femoral sizer was placed under to the posterior  condyles with 3 degrees of external rotational which was parallel to the transepicondylar axis and perpendicular to [employer]. The femur was sized to be a size 9 in the anterior-  posterior dimension. The  anterior, posterior, and  chamfer cuts were made without difficulty nor   notching making  certain that I was along the anterior cortex to help  with flexion gap stability. Next a laminar spreader was placed with the knee in flexion and the medial lateral menisci were resected.  5 cc of the Exparel mixture was injected in the medial side of the back of the knee and 3 cc in the lateral side.  1/2 inch curved osteotome was used to resect posterior osteophyte that was then removed with a pituitary rongeur.       At this point, the tibia was sized to be a size E.  The size E tray was  then pinned in position. Trial reduction was now carried with a 9 femur, E tibia, a 10 mm MC insert.  The knee had full extension and was stable to varus valgus stress in extension.  The knee was well balanced in flexion and the PCL was left intact.  Attention was next directed to the patella.  Precut  measurement was noted to be 21 mm.  I resected down to 13 mm and used a  53mm patellar button to restore patellar height as well as cover the cut surface.     The patella lug holes were drilled and a 35 mm patella poly trial was placed.    The knee was brought to full extension with good flexion stability with the patella tracking through the trochlea without application of pressure.     Next the femoral component was again assessed and determined to be seated and appropriately lateralized.  The femoral lug holes were drilled.  The femoral component was then removed. Tibial component was again assessed and felt to be seated and appropriately rotated with the medial third of the tubercle. The tibia was then drilled, and keel punched.     Final components were  opened and regular cement was mixed.      Final implants were then  cemented onto cleaned and dried cut surfaces of bone with the knee brought to extension with a 10 mm MC poly.  The knee was irrigated with sterile Betadine diluted in saline as well as pulse lavage normal saline.  The synovial lining was  then injected a dilute Exparel.      Once the cement  had fully cured, excess cement was removed throughout the knee.  I confirmed that I was satisfied with the range of motion and stability, and the final 10 mm MC poly insert was chosen.  It was placed into the knee.         The tourniquet had been let down.  No significant hemostasis was required.  The medial parapatellar arthrotomy was then reapproximated using #1 Vicryl and #1 Stratafix sutures with the knee  in flexion.  The remaining wound was closed with 0 stratafix, 2-0 Vicryl, and running 3-0 Monocryl. The knee was cleaned, dried, dressed sterilely using Dermabond and   Aquacel dressing.  The patient was then brought to recovery room in stable condition, tolerating the procedure  well. There were no complications.   Post op recs: WB: WBAT Abx: ancef x23 hours post op Imaging: PACU xrays DVT prophylaxis: Aspirin 81mg  BID x4 weeks, resume Plavix starting postop day 2 Follow up: 2 weeks  after surgery for a wound check with Dr. Blanchie Dessert at [facility].  Address: [address], [place], [place] [address]  Office Phone: [phone]  Weber Cooks, MD Orthopaedic Surgery

## 2022-03-11 NOTE — Evaluation (Signed)
Physical Therapy Evaluation Patient Details Name: Angela Wallace MRN: [id number] DOB: 10/02/1960 Today's Date: 03/11/2022  History of Present Illness  Pt is a 61yo female presenting s/p L-TKA on 03/11/22. PMH: HTN, hx of stroke.  Clinical Impression  ELLENORA TALTON is a 61 y.o. female POD 0 s/p L-TKA. Patient reports modified independence using SPC mobility at baseline. Patient is now limited by functional impairments (see PT problem list below) and requires min guard for transfers and gait with RW. Patient was able to ambulate 80 feet with RW and min guard and cues for safe walker management. Patient educated on safe sequencing for stair mobility and verbalized safe guarding position for people assisting with mobility. Patient instructed in exercises to facilitate ROM and circulation. Patient will benefit from continued skilled PT interventions to address impairments and progress towards PLOF. Patient has met mobility goals at adequate level for discharge home; will continue to follow if pt continues acute stay to progress towards Mod I goals.       Recommendations for follow up therapy are one component of a multi-disciplinary discharge planning process, led by the attending physician.  Recommendations may be updated based on patient status, additional functional criteria and insurance authorization.  Follow Up Recommendations Follow physician's recommendations for discharge plan and follow up therapies      Assistance Recommended at Discharge Set up Supervision/Assistance  Patient can return home with the following  A little help with walking and/or transfers;A little help with bathing/dressing/bathroom;Assistance with cooking/housework;Assist for transportation;Help with stairs or ramp for entrance    Equipment Recommendations Rolling walker (2 wheels)  Recommendations for Other Services       Functional Status Assessment Patient has had a recent decline in their functional status  and demonstrates the ability to make significant improvements in function in a reasonable and predictable amount of time.     Precautions / Restrictions Precautions Precautions: None Restrictions Weight Bearing Restrictions: No Other Position/Activity Restrictions: wbat      Mobility  Bed Mobility Overal bed mobility: Modified Independent Bed Mobility: Supine to Sit     Supine to sit: Modified independent (Device/Increase time)     General bed mobility comments: increased time    Transfers Overall transfer level: Needs assistance Equipment used: Rolling walker (2 wheels) Transfers: Sit to/from Stand Sit to Stand: Min guard           General transfer comment: For safety, no physical assist required, VCs for sequencing    Ambulation/Gait Ambulation/Gait assistance: Min guard, Supervision Gait Distance (Feet): 80 Feet Assistive device: Rolling walker (2 wheels) Gait Pattern/deviations: Step-to pattern Gait velocity: decreased     General Gait Details: Pt ambulated 6f with RW and min guard, no physical assist required or overt LOB noted. VCs for proximity to device and shorter step length.  Stairs            Wheelchair Mobility    Modified Rankin (Stroke Patients Only)       Balance Overall balance assessment: Needs assistance Sitting-balance support: Feet supported, No upper extremity supported Sitting balance-Leahy Scale: Fair     Standing balance support: Reliant on assistive device for balance, During functional activity, Bilateral upper extremity supported Standing balance-Leahy Scale: Poor                               Pertinent Vitals/Pain Pain Assessment Pain Assessment: 0-10 Pain Score: 6  Pain Location: L knee Pain Descriptors /  Indicators: Operative site guarding, Discomfort, Headache, Aching Pain Intervention(s): Limited activity within patient's tolerance, Monitored during session, Repositioned, Ice applied    Home  Living Family/patient expects to be discharged to:: Private residence Living Arrangements: Alone Available Help at Discharge: Family;Available 24 hours/day (Son, neighbor) Type of Home: House Home Access: Level entry       Home Layout: One level Home Equipment: Tub bench;Cane - single point;Grab bars - toilet;Grab bars - tub/shower;Hand held shower head      Prior Function Prior Level of Function : Independent/Modified Independent;Working/employed;Driving             Mobility Comments: SPC during periods of high pain ADLs Comments: ind     Hand Dominance   Dominant Hand: Right    Extremity/Trunk Assessment   Upper Extremity Assessment Upper Extremity Assessment: Overall WFL for tasks assessed    Lower Extremity Assessment Lower Extremity Assessment: RLE deficits/detail;LLE deficits/detail RLE Deficits / Details: MMT ank DF/PF 5/5 RLE Sensation: WNL LLE Deficits / Details: MMT ank DF/PF 5/5, no extensor lag noted. LLE Sensation: WNL    Cervical / Trunk Assessment Cervical / Trunk Assessment: Normal  Communication   Communication: No difficulties  Cognition Arousal/Alertness: Awake/alert Behavior During Therapy: WFL for tasks assessed/performed Overall Cognitive Status: Within Functional Limits for tasks assessed                                          General Comments      Exercises Total Joint Exercises Ankle Circles/Pumps: AROM, Both, 10 reps Quad Sets: AROM, Left, Other reps (comment) (2) Short Arc Quad: AROM, Left, Other reps (comment) (2) Heel Slides: AROM, Left, Other reps (comment) (2) Hip ABduction/ADduction: AROM, Left, Other reps (comment) (2) Straight Leg Raises: AROM, Left, Other reps (comment) (2)   Assessment/Plan    PT Assessment Patient needs continued PT services  PT Problem List Decreased strength;Decreased range of motion;Decreased activity tolerance;Decreased balance;Decreased mobility;Decreased  coordination;Pain       PT Treatment Interventions DME instruction;Gait training;Stair training;Functional mobility training;Therapeutic activities;Therapeutic exercise;Balance training;Neuromuscular re-education;Patient/family education    PT Goals (Current goals can be found in the Care Plan section)  Acute Rehab PT Goals Patient Stated Goal: Walk without pain PT Goal Formulation: With patient Time For Goal Achievement: 03/18/22 Potential to Achieve Goals: Good    Frequency 7X/week     Co-evaluation               AM-PAC PT "6 Clicks" Mobility  Outcome Measure Help needed turning from your back to your side while in a flat bed without using bedrails?: None Help needed moving from lying on your back to sitting on the side of a flat bed without using bedrails?: A Little Help needed moving to and from a bed to a chair (including a wheelchair)?: A Little Help needed standing up from a chair using your arms (e.g., wheelchair or bedside chair)?: A Little Help needed to walk in hospital room?: A Little Help needed climbing 3-5 steps with a railing? : A Little 6 Click Score: 19    End of Session Equipment Utilized During Treatment: Gait belt Activity Tolerance: Patient tolerated treatment well;No increased pain Patient left: in chair;with call bell/phone within reach Nurse Communication: Mobility status PT Visit Diagnosis: Pain;Difficulty in walking, not elsewhere classified (R26.2) Pain - Right/Left: Left Pain - part of body: Knee    Time: 1330-1420 PT Time  Calculation (min) (ACUTE ONLY): 50 min   Charges:   PT Evaluation $PT Eval Low Complexity: 1 Low PT Treatments $Gait Training: 8-22 mins $Therapeutic Exercise: 8-22 mins        Coolidge Breeze, PT, DPT [facility] Department Office: [phone] Pager: [phone]  Coolidge Breeze 03/11/2022, 3:32 PM

## 2022-03-11 NOTE — Addendum Note (Signed)
Addendum  created 03/11/22 1414 by Romanda Turrubiates W, CRNA   Charge Capture section accepted    

## 2022-03-11 NOTE — Anesthesia Postprocedure Evaluation (Signed)
Anesthesia Post Note  Patient: Angela Wallace  Procedure(s) Performed: TOTAL KNEE ARTHROPLASTY (Left: Knee)     Patient location during evaluation: PACU Anesthesia Type: Spinal Level of consciousness: oriented and awake and alert Pain management: pain level controlled Vital Signs Assessment: post-procedure vital signs reviewed and stable Respiratory status: spontaneous breathing, respiratory function stable and patient connected to nasal cannula oxygen Cardiovascular status: blood pressure returned to baseline and stable Postop Assessment: no headache, no backache and no apparent nausea or vomiting Anesthetic complications: no   No notable events documented.  Last Vitals:  Vitals:   03/11/22 1045 03/11/22 1100  BP: (!) 151/86 (!) 158/75  Pulse: (!) 50 (!) 51  Resp: 17 15  Temp:    SpO2: 100% 100%    Last Pain:  Vitals:   03/11/22 1100  TempSrc:   PainSc: 3                  Jami Ohlin S

## 2022-03-11 NOTE — Interval H&P Note (Signed)

## 2022-03-11 NOTE — Anesthesia Procedure Notes (Signed)
Anesthesia Procedure Image    

## 2022-03-12 ENCOUNTER — Encounter (HOSPITAL_COMMUNITY): Payer: Self-pay | Admitting: Orthopedic Surgery

## 2022-04-21 ENCOUNTER — Other Ambulatory Visit: Payer: Self-pay | Admitting: Adult Health

## 2022-04-22 ENCOUNTER — Encounter: Payer: Self-pay | Admitting: *Deleted

## 2022-06-02 ENCOUNTER — Telehealth: Payer: Self-pay | Admitting: Adult Health

## 2022-06-02 NOTE — Telephone Encounter (Signed)
Informed pt of r/s for 5/8 appt- NP out.

## 2022-07-02 ENCOUNTER — Other Ambulatory Visit (HOSPITAL_COMMUNITY): Payer: Self-pay | Admitting: Internal Medicine

## 2022-07-02 DIAGNOSIS — R051 Acute cough: Secondary | ICD-10-CM

## 2022-07-03 ENCOUNTER — Ambulatory Visit (HOSPITAL_COMMUNITY)
Admission: RE | Admit: 2022-07-03 | Discharge: 2022-07-03 | Disposition: A | Discharge status: Home / Self Care | Payer: No Typology Code available for payment source | Source: Ambulatory Visit | Attending: Internal Medicine | Admitting: Internal Medicine

## 2022-07-03 DIAGNOSIS — R051 Acute cough: Secondary | ICD-10-CM | POA: Insufficient documentation

## 2022-10-05 ENCOUNTER — Encounter: Payer: Self-pay | Admitting: *Deleted

## 2022-11-11 ENCOUNTER — Encounter: Payer: Self-pay | Admitting: *Deleted

## 2022-12-03 ENCOUNTER — Encounter (HOSPITAL_COMMUNITY): Payer: Self-pay | Admitting: Emergency Medicine

## 2022-12-03 ENCOUNTER — Other Ambulatory Visit: Payer: Self-pay

## 2022-12-03 ENCOUNTER — Emergency Department (HOSPITAL_COMMUNITY): Payer: No Typology Code available for payment source

## 2022-12-03 ENCOUNTER — Emergency Department (HOSPITAL_COMMUNITY)
Admission: EM | Admit: 2022-12-03 | Discharge: 2022-12-03 | Disposition: A | Discharge status: Home / Self Care | Payer: No Typology Code available for payment source | Attending: Emergency Medicine | Admitting: Emergency Medicine

## 2022-12-03 DIAGNOSIS — G43909 Migraine, unspecified, not intractable, without status migrainosus: Secondary | ICD-10-CM | POA: Insufficient documentation

## 2022-12-03 DIAGNOSIS — I10 Essential (primary) hypertension: Secondary | ICD-10-CM | POA: Diagnosis not present

## 2022-12-03 DIAGNOSIS — Z7902 Long term (current) use of antithrombotics/antiplatelets: Secondary | ICD-10-CM | POA: Diagnosis not present

## 2022-12-03 DIAGNOSIS — R42 Dizziness and giddiness: Secondary | ICD-10-CM | POA: Diagnosis present

## 2022-12-03 DIAGNOSIS — Z8673 Personal history of transient ischemic attack (TIA), and cerebral infarction without residual deficits: Secondary | ICD-10-CM | POA: Diagnosis not present

## 2022-12-03 DIAGNOSIS — R001 Bradycardia, unspecified: Secondary | ICD-10-CM | POA: Insufficient documentation

## 2022-12-03 DIAGNOSIS — Z91010 Allergy to peanuts: Secondary | ICD-10-CM | POA: Diagnosis not present

## 2022-12-03 DIAGNOSIS — R944 Abnormal results of kidney function studies: Secondary | ICD-10-CM | POA: Diagnosis not present

## 2022-12-03 DIAGNOSIS — Z79899 Other long term (current) drug therapy: Secondary | ICD-10-CM | POA: Diagnosis not present

## 2022-12-03 DIAGNOSIS — G43109 Migraine with aura, not intractable, without status migrainosus: Secondary | ICD-10-CM

## 2022-12-03 LAB — CBC
HCT: 39.5 % (ref 36.0–46.0)
Hemoglobin: 13 g/dL (ref 12.0–15.0)
MCH: 29.5 pg (ref 26.0–34.0)
MCHC: 32.9 g/dL (ref 30.0–36.0)
MCV: 89.8 fL (ref 80.0–100.0)
Platelets: 273 10*3/uL (ref 150–400)
RBC: 4.4 MIL/uL (ref 3.87–5.11)
RDW: 13.2 % (ref 11.5–15.5)
WBC: 7.8 10*3/uL (ref 4.0–10.5)
nRBC: 0 % (ref 0.0–0.2)

## 2022-12-03 LAB — URINALYSIS, ROUTINE W REFLEX MICROSCOPIC
Bilirubin Urine: NEGATIVE
Glucose, UA: NEGATIVE mg/dL
Hgb urine dipstick: NEGATIVE
Ketones, ur: NEGATIVE mg/dL
Leukocytes,Ua: NEGATIVE
Nitrite: NEGATIVE
Protein, ur: NEGATIVE mg/dL
Specific Gravity, Urine: 1.011 (ref 1.005–1.030)
pH: 5 (ref 5.0–8.0)

## 2022-12-03 LAB — BASIC METABOLIC PANEL
Anion gap: 10 (ref 5–15)
BUN: 42 mg/dL — ABNORMAL HIGH (ref 8–23)
CO2: 27 mmol/L (ref 22–32)
Calcium: 9.7 mg/dL (ref 8.9–10.3)
Chloride: 105 mmol/L (ref 98–111)
Creatinine, Ser: 1.91 mg/dL — ABNORMAL HIGH (ref 0.44–1.00)
GFR, Estimated: 29 mL/min — ABNORMAL LOW (ref >60)
Glucose, Bld: 121 mg/dL — ABNORMAL HIGH (ref 70–99)
Potassium: 3.7 mmol/L (ref 3.5–5.1)
Sodium: 142 mmol/L (ref 135–145)

## 2022-12-03 LAB — APTT: aPTT: 38 seconds — ABNORMAL HIGH (ref 24–36)

## 2022-12-03 LAB — ETHANOL: Alcohol, Ethyl (B): <10 mg/dL (ref <10)

## 2022-12-03 LAB — CBG MONITORING, ED: Glucose-Capillary: 126 mg/dL — ABNORMAL HIGH (ref 70–99)

## 2022-12-03 LAB — PROTIME-INR
INR: 1 (ref 0.8–1.2)
Prothrombin Time: 13.4 seconds (ref 11.4–15.2)

## 2022-12-03 LAB — TROPONIN I (HIGH SENSITIVITY): Troponin I (High Sensitivity): 7 ng/L (ref <18)

## 2022-12-03 MED ORDER — PROCHLORPERAZINE EDISYLATE 10 MG/2ML IJ SOLN
10.0000 mg | Freq: Once | INTRAMUSCULAR | Status: AC
  Administered 2022-12-03: 10 mg via INTRAVENOUS
  Filled 2022-12-03: qty 2

## 2022-12-03 MED ORDER — DEXAMETHASONE SODIUM PHOSPHATE 10 MG/ML IJ SOLN
10.0000 mg | Freq: Once | INTRAMUSCULAR | Status: AC
  Administered 2022-12-03: 10 mg via INTRAVENOUS
  Filled 2022-12-03: qty 1

## 2022-12-03 MED ORDER — DIPHENHYDRAMINE HCL 50 MG/ML IJ SOLN
12.5000 mg | Freq: Once | INTRAMUSCULAR | Status: AC
  Administered 2022-12-03: 12.5 mg via INTRAVENOUS
  Filled 2022-12-03: qty 1

## 2022-12-03 MED ORDER — SODIUM CHLORIDE 0.9% FLUSH
3.0000 mL | Freq: Once | INTRAVENOUS | Status: AC
  Administered 2022-12-03: 3 mL via INTRAVENOUS

## 2022-12-03 NOTE — ED Provider Notes (Signed)
[facility] AT [facility] Provider Note   CSN: [id number] Arrival date & time: 12/03/22  1191     History  Chief Complaint  Patient presents with   Dizziness   Numbness    Angela Wallace is a 62 y.o. female with a history significant for migraines, hypertension, CVA, kidney stones, arthritis and dysrhythmia presenting for evaluation of subtle lightheadedness, nausea with waves of diaphoresis and numbness in her left hand which radiates to her upper arm since yesterday.  She also has vision changes, described as a "jumping" of her field of vision with eye movement.  She has experienced this symptom with prior migraines.  She picked up her grandchild a couple of days ago and has had some discomfort in her lower back since then so has been taking a muscle relaxer and is unsure if her symptoms could be secondary to this medication or this is a new stroke versus complex migraine headache which she has had in the past.  She states her migraines do resemble her symptoms today, although she does not have a headache, her vision changes and numbness are similar to symptoms associated with migraine headaches.  She denies fevers or chills, denies weakness in her extremities, no facial droop or slurred speech.  She does endorse having some episodes of difficulty finding the right word when speaking.  She does endorse being under a lot of stress regarding some issues at work which could also be contributing with increased anxiety.  She is found no alleviators for her symptoms.  The history is provided by the patient.       Home Medications Prior to Admission medications   Medication Sig Start Date End Date Taking? Authorizing Provider  baclofen (LIORESAL) 10 MG tablet Take 1 tablet (10 mg total) by mouth 2 (two) times daily as needed (migraine headaches). 12/31/21   Ihor Austin, NP  Bempedoic Acid (NEXLETOL) 180 MG TABS Take 180 mg by mouth at bedtime.    [provider]  CALCIUM PO Take 1 tablet by mouth daily.    [provider]  Cholecalciferol (VITAMIN D3 PO) Take 1 tablet by mouth daily.    [provider]  clopidogrel (PLAVIX) 75 MG tablet Take 1 tablet (75 mg total) by mouth daily. 03/13/22   Joen Laura, MD  Coenzyme Q10 (COQ10) 200 MG CAPS Take 200 mg by mouth daily.     [provider]  FLUoxetine (PROZAC) 40 MG capsule Take 40 mg by mouth daily.    [provider]  gabapentin (NEURONTIN) 300 MG capsule Take 300 mg by mouth 2 (two) times daily. Takes 600    [provider]  GLUCOSAMINE-CHONDROITIN PO Take 1 tablet by mouth 2 (two) times daily.    [provider]  hydrochlorothiazide (MICROZIDE) 12.5 MG capsule Take 12.5 mg by mouth daily. 01/30/22   [provider]  LORazepam (ATIVAN) 0.5 MG tablet Take 0.5 mg by mouth as needed. Dose unknown    [provider]  losartan (COZAAR) 50 MG tablet Take 50 mg by mouth daily. 06/27/21   [provider]  MAGNESIUM PO Take 1 tablet by mouth daily.    [provider]  Omega-3 Fatty Acids (FISH OIL PO) Take 1 capsule by mouth 2 (two) times daily.    [provider]  propranolol ER (INDERAL LA) 60 MG 24 hr capsule Take 1 capsule (60 mg total) by mouth daily. 12/31/21   Ihor Austin, NP  Rimegepant Sulfate (NURTEC) 75 MG TBDP Take 75 mg by mouth daily as needed (For migraine abortive therapy). 12/31/21   Ihor Austin, NP  rosuvastatin (CRESTOR) 20 MG tablet TAKE ONE TABLET (20MG  TOTAL) BY MOUTH DAILY 04/21/22   Ihor Austin, NP      Allergies    Chocolate; Citrus; Codeine; Lipitor [atorvastatin]; Nitrates, organic; Peanut-containing drug products; and Pineapple    Review of Systems   Review of Systems  Constitutional:  Negative for fever.  HENT:  Negative for congestion and sore throat.   Eyes:  Positive for visual disturbance.  Respiratory:  Negative for chest tightness and shortness of  breath.   Cardiovascular:  Negative for chest pain.  Gastrointestinal:  Positive for nausea. Negative for abdominal pain and vomiting.  Genitourinary: Negative.   Musculoskeletal:  Negative for arthralgias, joint swelling and neck pain.  Skin: Negative.  Negative for rash and wound.  Neurological:  Positive for light-headedness and numbness. Negative for dizziness, weakness and headaches.  Psychiatric/Behavioral: Negative.      Physical Exam Updated Vital Signs BP 126/81 (BP Location: Right Arm)   Pulse (!) 54   Temp 98.3 F (36.8 C) (Oral)   Resp 17   SpO2 97%  Physical Exam Vitals and nursing note reviewed.  Constitutional:      Appearance: She is well-developed.     Comments: No distress  HENT:     Head: Normocephalic and atraumatic.     Right Ear: Tympanic membrane normal.     Left Ear: Tympanic membrane normal.  Eyes:     Extraocular Movements: Extraocular movements intact.     Conjunctiva/sclera: Conjunctivae normal.     Pupils: Pupils are equal, round, and reactive to light.  Cardiovascular:     Rate and Rhythm: Normal rate and regular rhythm.     Heart sounds: Normal heart sounds.  Pulmonary:     Effort: Pulmonary effort is normal.     Breath sounds: Normal breath sounds. No wheezing.  Abdominal:     General: Bowel sounds are normal.     Palpations: Abdomen is soft.     Tenderness: There is no abdominal tenderness.  Musculoskeletal:        General: Normal range of motion.     Cervical back: Normal range of motion and neck supple.  Lymphadenopathy:     Cervical: No cervical adenopathy.  Skin:    General: Skin is warm and dry.     Findings: No rash.  Neurological:     General: No focal deficit present.     Mental Status: She is alert and oriented to person, place, and time.     GCS: GCS eye subscore is 4. GCS verbal subscore is 5. GCS motor subscore is 6.     Cranial Nerves: No cranial nerve deficit.     Sensory: No sensory deficit.     Coordination:  Coordination normal.     Gait: Gait normal.     Deep Tendon Reflexes: Reflexes normal.     Comments: Normal heel-shin, normal rapid alternating movements. Cranial nerves III-XII intact.  No pronator drift.  Psychiatric:        Speech: Speech normal.        Behavior: Behavior normal.        Thought Content: Thought content normal.     ED Results / Procedures / Treatments   Labs (all labs ordered are listed, but only abnormal results are displayed) Labs Reviewed  BASIC METABOLIC PANEL - Abnormal; Notable for the  following components:      Result Value   Glucose, Bld 121 (*)    BUN 42 (*)    Creatinine, Ser 1.91 (*)    GFR, Estimated 29 (*)    All other components within normal limits  URINALYSIS, ROUTINE W REFLEX MICROSCOPIC - Abnormal; Notable for the following components:   APPearance HAZY (*)    All other components within normal limits  APTT - Abnormal; Notable for the following components:   aPTT 38 (*)    All other components within normal limits  CBG MONITORING, ED - Abnormal; Notable for the following components:   Glucose-Capillary 126 (*)    All other components within normal limits  CBC  PROTIME-INR  ETHANOL  TROPONIN I (HIGH SENSITIVITY)    EKG EKG Interpretation  Date/Time:  Thursday December 03 2022 10:22:17 EDT Ventricular Rate:  56 PR Interval:  158 QRS Duration: 148 QT Interval:  502 QTC Calculation: 484 R Axis:   9 Text Interpretation: Sinus bradycardia Left bundle branch block Abnormal ECG When compared with ECG of 26-May-2019 21:00, PREVIOUS ECG IS PRESENT No significant change since last tracing Confirmed by Lorre Nick ([id number]) on 12/04/2022 1:06:17 PM  Radiology MR BRAIN WO CONTRAST  Result Date: 12/03/2022 CLINICAL DATA:  Left-sided numbness, stroke suspected. EXAM: MRI HEAD WITHOUT CONTRAST TECHNIQUE: Multiplanar, multiecho pulse sequences of the brain and surrounding structures were obtained without intravenous contrast. COMPARISON:  Same-day CT  head FINDINGS: Brain: There is no acute intracranial hemorrhage, extra-axial fluid collection, or acute infarct. Parenchymal volume is normal. The ventricles are normal in size. Gray-white differentiation is preserved. Parenchymal signal is essentially normal for age, with a minimal burden of underlying chronic small-vessel ischemic change. The pituitary and suprasellar region are normal. There is no mass lesion. There is no mass effect or midline shift. Vascular: Normal flow voids. Skull and upper cervical spine: Normal marrow signal. Sinuses/Orbits: The paranasal sinuses are clear. The globes and orbits are unremarkable. Other: None. IMPRESSION: Normal for age brain MRI. Electronically Signed   By: Lesia Hausen M.D.   On: 12/03/2022 14:09   DG Lumbar Spine Complete  Result Date: 12/03/2022 CLINICAL DATA:  Low back pain after twisting injury 4 days ago. EXAM: LUMBAR SPINE - COMPLETE 4+ VIEW COMPARISON:  None Available. FINDINGS: Five lumbar type vertebral bodies. Minimal convex right lumbar spine curvature. Sacroiliac joints are symmetric. Straightening of expected lumbar lordosis. Loss of intervertebral disc height at L4-5 and L5-S1. Facet arthropathy at L4-5 and L5-S1. Maintenance of vertebral body height. IMPRESSION: Lower lumbar spondylosis, without acute superimposed process. Electronically Signed   By: Jeronimo Greaves M.D.   On: 12/03/2022 11:33   CT HEAD WO CONTRAST  Result Date: 12/03/2022 CLINICAL DATA:  Vertigo, central.  Numbness/dizziness. EXAM: CT HEAD WITHOUT CONTRAST TECHNIQUE: Contiguous axial images were obtained from the base of the skull through the vertex without intravenous contrast. RADIATION DOSE REDUCTION: This exam was performed according to the departmental dose-optimization program which includes automated exposure control, adjustment of the mA and/or kV according to patient size and/or use of iterative reconstruction technique. COMPARISON:  Head CT 05/02/2020. FINDINGS: Brain: No  acute hemorrhage, mass effect or midline shift. Gray-white differentiation is preserved. No hydrocephalus. No extra-axial collection. Basilar cisterns are patent. Vascular: No hyperdense vessel or unexpected calcification. Skull: No calvarial fracture or suspicious bone lesion. Skull base is unremarkable. Sinuses/Orbits: Unremarkable. Other: None. IMPRESSION: No acute intracranial abnormality. Electronically Signed   By: Orvan Falconer M.D.   On: 12/03/2022 10:55  Procedures Procedures    Medications Ordered in ED Medications  sodium chloride flush (NS) 0.9 % injection 3 mL (3 mLs Intravenous Given 12/03/22 1213)  prochlorperazine (COMPAZINE) injection 10 mg (10 mg Intravenous Given 12/03/22 1218)  diphenhydrAMINE (BENADRYL) injection 12.5 mg (12.5 mg Intravenous Given 12/03/22 1218)  dexamethasone (DECADRON) injection 10 mg (10 mg Intravenous Given 12/03/22 1218)    ED Course/ Medical Decision Making/ A&P                             Medical Decision Making Patient presenting with symptoms somewhat similar to prior migraine headaches, although she does not actually have a headache today she does have symptoms that generally present with her migraines, however she also has a history of CVA versus TIA, raising the concern for this being a new stroke presentation.  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  With normal imaging, I favor this being a migraine equivalent.  She was given a migraine cocktail while here and her symptoms completely resolved.    Amount and/or Complexity of Data Reviewed Labs: ordered.    Details: Labs reviewed, delta troponins are negative, glucose is 121, she does have a renal insufficiency at 1.9, historically her creatinine is elevated, her last several measures were 1.3-1.45. Radiology:  ordered.    Details: CT head is negative for acute intracranial hemorrhage.  We proceeded to MRI which is negative for CVA or other intracranial process.  Lumbar spine was also imaged given her complaint of pain after picking up her grandchild.  Results of the lower lumbar spondylosis.  Risk Prescription drug management.           Final Clinical Impression(s) / ED Diagnoses Final diagnoses:  Migraine equivalent    Rx / DC Orders ED Discharge Orders     None         Victoriano Lain 12/25/22 0913    Vanetta Mulders, MD 01/15/23 1921

## 2022-12-03 NOTE — ED Notes (Signed)
Edp aware of pt

## 2022-12-03 NOTE — ED Provider Triage Note (Signed)
Emergency Medicine Provider Triage Evaluation Note  Angela Wallace , a 62 y.o. female  was evaluated in triage.  Pt complains of numbness, dizziness, nausea, waves of diaphoresis since yesterday. H/o tia. Low back pain since picking up her grandchild several days ago, has been on a muscle relaxer, unclear if her symptoms could be due to this medication.  Also reports had some chest pressure yesterday, gone today.  Review of Systems  Positive: Dizziness, nausea, diaphoresis. Negative: Fevers, vomiting, weakness  Physical Exam  BP (!) 142/78 (BP Location: Right Arm)   Pulse (!) 54   Temp 98.4 F (36.9 C) (Oral)   Resp 17   SpO2 100%  Gen:   Awake, no distress   Resp:  Normal effort  MSK:   Moves extremities without difficulty  Other:    Medical Decision Making  Medically screening exam initiated at 10:51 AM.  Appropriate orders placed.  Angela Wallace was informed that the remainder of the evaluation will be completed by another provider, this initial triage assessment does not replace that evaluation, and the importance of remaining in the ED until their evaluation is complete.  CT head is already been completed, pending results, given patient's TIA history will probably need to proceed to MRI.   Evalee Jefferson, PA-C 12/03/22 1054

## 2022-12-03 NOTE — ED Notes (Signed)
Patient transported to MRI 

## 2022-12-03 NOTE — ED Notes (Signed)
ED Provider at bedside. 

## 2022-12-03 NOTE — Discharge Instructions (Signed)
As discussed, your imaging tests and lab tests are negative for stroke, given that your symptoms resolved after receiving the migraine medications,  this appears to have been a complicated migraine with the numbness symptoms.    Plan to follow up with your neurologist as you have scheduled.  In the interim, get rechecked sooner for any return or worsened symptoms.

## 2022-12-03 NOTE — ED Triage Notes (Signed)
Pt c/o waves of nausea, cold sweats, dizziness, jerkiness per pt since yesterday. A/o. Color wnl. Non diaphoretic. Numbness to left hand going up. Moving all extremities in triage. Facial symmetry. Pt states fell in tub this am due to some dizziness. Left lower back pain since fall

## 2022-12-03 NOTE — ED Notes (Signed)
Pt passed swallow screen

## 2022-12-04 NOTE — ED Provider Notes (Incomplete)
[facility] Provider Note   CSN: [id number] Arrival date & time: 12/03/22  [id number]     History {Add pertinent medical, surgical, social history, OB history to HPI:1} Chief Complaint  Patient presents with  . Dizziness  . Numbness    Angela Wallace is a 62 y.o. female.  The history is provided by the patient.       Home Medications Prior to Admission medications   Medication Sig Start Date End Date Taking? Authorizing Provider  baclofen (LIORESAL) 10 MG tablet Take 1 tablet (10 mg total) by mouth 2 (two) times daily as needed (migraine headaches). 12/31/21   Frann Rider, NP  Bempedoic Acid (NEXLETOL) 180 MG TABS Take 180 mg by mouth at bedtime.    [provider]  CALCIUM PO Take 1 tablet by mouth daily.    [provider]  Cholecalciferol (VITAMIN D3 PO) Take 1 tablet by mouth daily.    [provider]  clopidogrel (PLAVIX) 75 MG tablet Take 1 tablet (75 mg total) by mouth daily. 03/13/22   Willaim Sheng, MD  Coenzyme Q10 (COQ10) 200 MG CAPS Take 200 mg by mouth daily.     [provider]  FLUoxetine (PROZAC) 40 MG capsule Take 40 mg by mouth daily.    [provider]  gabapentin (NEURONTIN) 300 MG capsule Take 300 mg by mouth 2 (two) times daily. Takes 600    [provider]  GLUCOSAMINE-CHONDROITIN PO Take 1 tablet by mouth 2 (two) times daily.    [provider]  hydrochlorothiazide (MICROZIDE) 12.5 MG capsule Take 12.5 mg by mouth daily. 01/30/22   [provider]  LORazepam (ATIVAN) 0.5 MG tablet Take 0.5 mg by mouth as needed. Dose unknown    [provider]  losartan (COZAAR) 50 MG tablet Take 50 mg by mouth daily. 06/27/21   [provider]  MAGNESIUM PO Take 1 tablet by mouth daily.    [provider]  Omega-3 Fatty Acids (FISH OIL PO) Take 1 capsule by mouth 2 (two) times daily.    [provider]  propranolol ER  (INDERAL LA) 60 MG 24 hr capsule Take 1 capsule (60 mg total) by mouth daily. 12/31/21   Frann Rider, NP  Rimegepant Sulfate (NURTEC) 75 MG TBDP Take 75 mg by mouth daily as needed (For migraine abortive therapy). 12/31/21   Frann Rider, NP  rosuvastatin (CRESTOR) 20 MG tablet TAKE ONE TABLET (20MG  TOTAL) BY MOUTH DAILY 04/21/22   Frann Rider, NP      Allergies    Chocolate; Citrus; Codeine; Lipitor [atorvastatin]; Nitrates, organic; Peanut-containing drug products; and Pineapple    Review of Systems   Review of Systems  Physical Exam Updated Vital Signs BP (!) 142/78 (BP Location: Right Arm)   Pulse (!) 54   Temp 98.4 F (36.9 C) (Oral)   Resp 17   SpO2 100%  Physical Exam  ED Results / Procedures / Treatments   Labs (all labs ordered are listed, but only abnormal results are displayed) Labs Reviewed  CBG MONITORING, ED - Abnormal; Notable for the following components:      Result Value   Glucose-Capillary 126 (*)    All other components within normal limits  BASIC METABOLIC PANEL  CBC  URINALYSIS, ROUTINE W REFLEX MICROSCOPIC  PROTIME-INR  APTT  ETHANOL  I-STAT CHEM 8, ED  TROPONIN I (HIGH SENSITIVITY)    EKG None  Radiology CT HEAD WO CONTRAST  Result Date: 12/03/2022 CLINICAL DATA:  Vertigo, central.  Numbness/dizziness. EXAM: CT HEAD WITHOUT CONTRAST TECHNIQUE: Contiguous axial images were obtained from the base of the skull through the vertex without intravenous contrast. RADIATION DOSE REDUCTION: This exam was performed according to the departmental dose-optimization program which includes automated exposure control, adjustment of the mA and/or kV according to patient size and/or use of iterative reconstruction technique. COMPARISON:  Head CT 05/02/2020. FINDINGS: Brain: No acute hemorrhage, mass effect or midline shift. Gray-white differentiation is preserved. No hydrocephalus. No extra-axial collection. Basilar cisterns are patent. Vascular: No hyperdense  vessel or unexpected calcification. Skull: No calvarial fracture or suspicious bone lesion. Skull base is unremarkable. Sinuses/Orbits: Unremarkable. Other: None. IMPRESSION: No acute intracranial abnormality. Electronically Signed   By: Emmit Alexanders M.D.   On: 12/03/2022 10:55    Procedures Procedures  {Document cardiac monitor, telemetry assessment procedure when appropriate:1}  Medications Ordered in ED Medications  sodium chloride flush (NS) 0.9 % injection 3 mL (has no administration in time range)    ED Course/ Medical Decision Making/ A&P   {   Click here for ABCD2, HEART and other calculatorsREFRESH Note before signing :1}                          Medical Decision Making Amount and/or Complexity of Data Reviewed Labs: ordered. Radiology: ordered.  Risk Prescription drug management.   ***  {Document critical care time when appropriate:1} {Document review of labs and clinical decision tools ie heart score, Chads2Vasc2 etc:1}  {Document your independent review of radiology images, and any outside records:1} {Document your discussion with family members, caretakers, and with consultants:1} {Document social determinants of health affecting pt's care:1} {Document your decision making why or why not admission, treatments were needed:1} Final Clinical Impression(s) / ED Diagnoses Final diagnoses:  None    Rx / DC Orders ED Discharge Orders     None

## 2022-12-09 ENCOUNTER — Ambulatory Visit: Payer: No Typology Code available for payment source | Admitting: Adult Health

## 2022-12-14 ENCOUNTER — Ambulatory Visit (INDEPENDENT_AMBULATORY_CARE_PROVIDER_SITE_OTHER): Payer: No Typology Code available for payment source | Admitting: Gastroenterology

## 2022-12-14 ENCOUNTER — Encounter: Payer: Self-pay | Admitting: Gastroenterology

## 2022-12-14 ENCOUNTER — Encounter: Payer: Self-pay | Admitting: *Deleted

## 2022-12-14 VITALS — BP 109/69 | HR 69 | Temp 97.7°F | Ht 71.0 in | Wt 230.0 lb

## 2022-12-14 DIAGNOSIS — R12 Heartburn: Secondary | ICD-10-CM

## 2022-12-14 DIAGNOSIS — Z1211 Encounter for screening for malignant neoplasm of colon: Secondary | ICD-10-CM | POA: Diagnosis not present

## 2022-12-14 DIAGNOSIS — Z800 Family history of malignant neoplasm of digestive organs: Secondary | ICD-10-CM

## 2022-12-14 NOTE — Progress Notes (Signed)
GI Office Note    Referring Provider: Elfredia Nevins, MD Primary Care Physician:  Elfredia Nevins, MD  Primary Gastroenterologist: Gerrit Friends.Rourk, MD  Chief Complaint   Chief Complaint  Patient presents with   New Patient (Initial Visit)    Pt referred for colonoscopy   History of Present Illness   Angela Wallace is a 62 y.o. female presenting today at the request of Elfredia Nevins, MD for evaluation prior to colonoscopy given blood thinner.   Had colonoscopy at age 52 performed at [facility] by Dr. Lovell Sheehan. Has hemorrhoids.   Father has had part of his colon removed due to colon cancer. Brother also had partial colon resection and chemo   Son - 34yo had stage 4 colon cancer and metastasized to appendix and liver. Chemo, partial colon resection and partial hepatectomy.  Has very rare intermittent hemorrhoids and bleeding if she eats too much popcorn. Does not really have constipation - used to have issues. Used to also have migraines - does not take pain medication.   Had ED visit a couple weeks ago for complex migraine with numbness, dropping things, vision changes, and nausea.   Stroke about 3-4 years ago (small TIA).   No alcohol use. Former social smoker. At least 10 years free.   Has had some recent heartburn and using Tums no more than once a day and not daily. Usually only on the weekends. Has a lot of stress and intermittent chest discomfort. Recent negative cardiac workup.    Current Outpatient Medications  Medication Sig Dispense Refill   aspirin EC 81 MG tablet Take by mouth.     baclofen (LIORESAL) 10 MG tablet Take 1 tablet (10 mg total) by mouth 2 (two) times daily as needed (migraine headaches). 30 each 11   Bempedoic Acid (NEXLETOL) 180 MG TABS Take 180 mg by mouth at bedtime.     CALCIUM PO Take 1 tablet by mouth daily.     Cholecalciferol (VITAMIN D3 PO) Take 1 tablet by mouth daily.     clopidogrel (PLAVIX) 75 MG tablet Take 1 tablet (75 mg  total) by mouth daily.     Coenzyme Q10 (COQ10) 200 MG CAPS Take 200 mg by mouth daily.      FLUoxetine (PROZAC) 40 MG capsule Take 40 mg by mouth daily.     gabapentin (NEURONTIN) 300 MG capsule Take 1,200 mg by mouth 2 (two) times daily. Takes 600     hydrochlorothiazide (MICROZIDE) 12.5 MG capsule Take 12.5 mg by mouth daily.     LORazepam (ATIVAN) 0.5 MG tablet Take 0.5 mg by mouth as needed. Dose unknown     losartan (COZAAR) 50 MG tablet Take 50 mg by mouth daily.     MAGNESIUM PO Take 1 tablet by mouth daily.     Omega-3 Fatty Acids (FISH OIL PO) Take 1 capsule by mouth 2 (two) times daily.     propranolol ER (INDERAL LA) 60 MG 24 hr capsule Take 1 capsule (60 mg total) by mouth daily. 90 capsule 3   Rimegepant Sulfate (NURTEC) 75 MG TBDP Take 75 mg by mouth daily as needed (For migraine abortive therapy). 10 tablet 5   rosuvastatin (CRESTOR) 20 MG tablet TAKE ONE TABLET (20MG  TOTAL) BY MOUTH DAILY 90 tablet 3   No current facility-administered medications for this visit.    Past Medical History:  Diagnosis Date   Anxiety    Arthritis    Dysrhythmia    History of kidney stones  Hypertension    Migraines    Pneumonia    Sleep apnea    Stroke    Tuberculosis    test positive, never had    Past Surgical History:  Procedure Laterality Date   ABDOMINAL HYSTERECTOMY     CYST EXCISION     left knee   KNEE ARTHROSCOPY Left    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   Procedure: TOTAL KNEE ARTHROPLASTY;  Surgeon: Joen Laura, MD;  Location: [facility];  Service: Orthopedics;  Laterality: Left;   WISDOM TOOTH EXTRACTION      Family History  Problem Relation Age of Onset   Heart failure Mother    Heart failure Father    Hypertension Other     Allergies as of 12/14/2022 - Review Complete 12/14/2022  Allergen Reaction Noted   Chocolate Other (See Comments) 05/26/2019   Citrus Other (See Comments) 05/26/2019   Codeine Other (See Comments) 06/29/2016   Lipitor  [atorvastatin] Other (See Comments) 09/27/2019   Nitrates, organic Other (See Comments) 05/26/2019   Peanut-containing drug products Other (See Comments) 05/26/2019   Pineapple Other (See Comments) 05/26/2019    Social History   Socioeconomic History   Marital status: Divorced    Spouse name: Not on file   Number of children: Not on file   Years of education: Not on file   Highest education level: Not on file  Occupational History   Not on file  Tobacco Use   Smoking status: Former    Years: 5    Types: Cigarettes   Smokeless tobacco: Never  Vaping Use   Vaping Use: Never used  Substance and Sexual Activity   Alcohol use: Yes    Comment: rare   Drug use: No   Sexual activity: Not on file  Other Topics Concern   Not on file  Social History Narrative   Not on file   Social Determinants of Health   Financial Resource Strain: Not on file  Food Insecurity: Not on file  Transportation Needs: Not on file  Physical Activity: Not on file  Stress: Not on file  Social Connections: Not on file  Intimate Partner Violence: Not on file     Review of Systems   Gen: Denies any fever, chills, fatigue, weight loss, lack of appetite.  CV: Denies chest pain, heart palpitations, peripheral edema, syncope.  Resp: Denies shortness of breath at rest or with exertion. Denies wheezing or cough.  GI: see HPI GU : Denies urinary burning, urinary frequency, urinary hesitancy MS: Denies joint pain, muscle weakness, cramps, or limitation of movement.  Derm: Denies rash, itching, dry skin Psych: Denies depression, anxiety, memory loss, and confusion Heme: Denies bruising, bleeding, and enlarged lymph nodes.  Physical Exam   BP 109/69   Pulse 69   Temp 97.7 F (36.5 C)   Ht  (1.803 m)   Wt 230 lb (104.3 kg)   BMI 32.08 kg/m   General:   Alert and oriented. Pleasant and cooperative. Well-nourished and well-developed.  Head:  Normocephalic and atraumatic. Eyes:  Without  icterus, sclera clear and conjunctiva pink.  Ears:  Normal auditory acuity. Mouth:  No deformity or lesions, oral mucosa pink.  Lungs:  Clear to auscultation bilaterally. No wheezes, rales, or rhonchi. No distress.  Heart:  S1, S2 present without murmurs appreciated.  Abdomen:  +BS, soft, non-tender and non-distended. No HSM noted. No guarding or rebound. No masses appreciated. Rectal:  Deferred  Msk:  Symmetrical without gross deformities.  Normal posture. Extremities:  Without edema. Neurologic:  Alert and  oriented x4;  grossly normal neurologically. Skin:  Intact without significant lesions or rashes. Psych:  Alert and cooperative. Normal mood and affect.  Assessment   Angela Wallace is a 62 y.o. female with a history of, arthritis, HTN, migraines, and stroke maintained on Plavix presenting today for evaluation prior to scheduling colonoscopy.  Screening for colon cancer, family history of colon cancer: Last colonoscopy performed at age 34.  Performed by Dr. Lovell Sheehan.  Reports history of hemorrhoids but denies any history of polyps.  She has not significant family history of colon cancer in her father and brother.  Also with a history of stage IV colon cancer with metastasis to appendix and liver in her son requiring partial colectomy and hepatectomy as well as chemo.  Currently without any upper or lower GI symptoms other than occasional heartburn.  No alarm symptoms present.  Denies any melena or hematochezia.  Has been on Plavix for 3-4 years at time of her TIA, no residual side effects.  We will proceed with scheduling screening colonoscopy in the near future.  Occasional heartburn symptoms, more predominant on the weekends and likely dietary related. Using tums as needed.   PLAN   Proceed with colonoscopy with propofol by Dr. Jena Gauss in the near future: the risks, benefits, and alternatives have been discussed with the patient in detail. The patient states understanding and desires to  proceed. ASA 3 GERD diet Continue Tums as needed. If more frequent symptoms may start otc omeprazole.  Follow up as needed.    Brooke Bonito, MSN, FNP-BC, AGACNP-BC [facility]

## 2022-12-14 NOTE — Patient Instructions (Addendum)
We are scheduling you for a colonoscopy in the near future with Dr. Jena Gauss.   Continue Tums as needed. Can start omeprazole 20 mg once daily over the counter if you have more frequent heartburn symptoms.  Follow a GERD diet as best as able: Avoid fried, fatty, greasy, spicy, citrus foods. Avoid caffeine and carbonated beverages. Avoid chocolate. Try eating 4-6 small meals a day rather than 3 large meals. Do not eat within 3 hours of laying down. Prop head of bed up on wood or bricks to create a 6 inch incline.    It was a pleasure to see you today. I want to create trusting relationships with patients. If you receive a survey regarding your visit,  I greatly appreciate you taking time to fill this out on paper or through your MyChart. I value your feedback.   Brooke Bonito, MSN, FNP-BC, AGACNP-BC [facility]

## 2022-12-15 ENCOUNTER — Encounter: Payer: Self-pay | Admitting: *Deleted

## 2022-12-15 ENCOUNTER — Encounter: Payer: Self-pay | Admitting: Gastroenterology

## 2022-12-16 ENCOUNTER — Telehealth: Payer: Self-pay | Admitting: *Deleted

## 2022-12-16 ENCOUNTER — Encounter: Payer: Self-pay | Admitting: *Deleted

## 2022-12-16 NOTE — Telephone Encounter (Signed)
Pt called to reschedule her procedure on 01/15/23 because it's her anniversary at work. She has been rescheduled until 01/20/23. New instructions sent via MyChart

## 2023-01-06 ENCOUNTER — Ambulatory Visit: Payer: No Typology Code available for payment source | Admitting: Adult Health

## 2023-01-07 NOTE — Progress Notes (Signed)
Chief Complaint  Patient presents with   Room 2    Pt is here Alone. Pt states that she was diagnosed with complicated Migraines on 12/14/2022 at the hospital. Pt states that she was having waves of nausea and was dropping things that day.     HISTORY OF PRESENT ILLNESS:  01/11/23 ALL:  Angela Wallace is a 62 y.o. female here today for follow up for history of CVA and migraines. She continues propranolol ER 60mg  daily and Nurtec as needed.She was doing fairly well until recently. She reports stress at work has significantly increased. She was seen in the ER 12/03/2022 for a complicated migraine. She had dizziness and left hand clumsiness. ER eval unremarkable. She reports Nurtec helps with some migraines and not with others. Baclofen usually helps. She usually takes gabapentin 600mg  BID, Prozac 40mg  daily and Ativan as needed for mood management.   She continues rosuvastatin and Plavix daily. She is followed regularly by PCP. BP is usually well managed.   Tried and failed: topiramate (joint pain), propranolol (taking now), tricyclics contraindicated due to mood management meds, gabapentin (on now), baclofen (on now), Nurtec (on now), triptans contraindicated in CVA.   Update 12/31/2021 JM: Patient returns for 63-month stroke and migraine follow-up.  Overall stable without new stroke/TIA symptoms.  Compliant on stroke prevention medications.  Blood pressure today 122/84.  Closely follows with PCP and cardiology. Reports about 2 severe migraines over the past 6 months- severe migraines can last up to 3 days and debilitating.  Has been having tension type headaches due to increased stressors (son dx'd with stage 3 colon cancer back in December, undergoing chemo currently, lives in [place] with child on the way due end of this month).  Remains on propranolol, denies side effects.  Use of Nurtec with benefit but baclofen seems to be more beneficial.  Reports nightly use of CPAP for apnea management.  Recently cleared by cardiology to pursue left knee replacement - not yet scheduled.  No further concerns at this time.   REVIEW OF SYSTEMS: Out of a complete 14 system review of symptoms, the patient complains only of the following symptoms, headaches, and all other reviewed systems are negative.   ALLERGIES: Allergies  Allergen Reactions   Chocolate Other (See Comments)    Consuming a little more than usual can trigger a migraine   Citrus Other (See Comments)    Consuming a little more than usual can trigger a migraine   Codeine Other (See Comments)    Headache   Lipitor [Atorvastatin] Other (See Comments)    Joint pain   Nitrates, Organic Other (See Comments)    Consuming a little more than usual can trigger a migraine   Peanut-Containing Drug Products Other (See Comments)    Consuming a little more than usual can trigger a migraine   Pineapple Other (See Comments)    Consuming a little more than usual can trigger a migraine     HOME MEDICATIONS: Outpatient Medications Prior to Visit  Medication Sig Dispense Refill   aspirin EC 81 MG tablet Take by mouth.     Bempedoic Acid (NEXLETOL) 180 MG TABS Take 180 mg by mouth at bedtime.     CALCIUM PO Take 1 tablet by mouth daily.     Cholecalciferol (VITAMIN D3 PO) Take 1 tablet by mouth daily.     clopidogrel (PLAVIX) 75 MG tablet Take 1 tablet (75 mg total) by mouth daily.     Coenzyme Q10 (COQ10)  200 MG CAPS Take 200 mg by mouth daily.      FLUoxetine (PROZAC) 40 MG capsule Take 40 mg by mouth daily.     gabapentin (NEURONTIN) 300 MG capsule Take 600 mg by mouth 2 (two) times daily.     hydrochlorothiazide (MICROZIDE) 12.5 MG capsule Take 12.5 mg by mouth daily.     LORazepam (ATIVAN) 0.5 MG tablet Take 0.5 mg by mouth as needed. Dose unknown     losartan (COZAAR) 50 MG tablet Take 50 mg by mouth daily.     MAGNESIUM PO Take 1 tablet by mouth daily.     Omega-3 Fatty Acids (FISH OIL PO) Take 1 capsule by mouth 2 (two) times  daily.     rosuvastatin (CRESTOR) 20 MG tablet TAKE ONE TABLET (20MG  TOTAL) BY MOUTH DAILY 90 tablet 3   baclofen (LIORESAL) 10 MG tablet Take 1 tablet (10 mg total) by mouth 2 (two) times daily as needed (migraine headaches). 30 each 11   gabapentin (NEURONTIN) 300 MG capsule Take 1,200 mg by mouth 2 (two) times daily. Takes 600     propranolol ER (INDERAL LA) 60 MG 24 hr capsule Take 1 capsule (60 mg total) by mouth daily. 90 capsule 3   Rimegepant Sulfate (NURTEC) 75 MG TBDP Take 75 mg by mouth daily as needed (For migraine abortive therapy). 10 tablet 5   No facility-administered medications prior to visit.     PAST MEDICAL HISTORY: Past Medical History:  Diagnosis Date   Anxiety    Arthritis    Dysrhythmia    History of kidney stones    Hypertension    Migraines    Pneumonia    Sleep apnea    Stroke (HCC)    Tuberculosis    test positive, never had     PAST SURGICAL HISTORY: Past Surgical History:  Procedure Laterality Date   ABDOMINAL HYSTERECTOMY     CYST EXCISION     left knee   KNEE ARTHROSCOPY Left    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   Procedure: TOTAL KNEE ARTHROPLASTY;  Surgeon: Joen Laura, MD;  Location: [facility];  Service: Orthopedics;  Laterality: Left;   WISDOM TOOTH EXTRACTION       FAMILY HISTORY: Family History  Problem Relation Age of Onset   Heart failure Mother    Heart failure Father    Hypertension Other      SOCIAL HISTORY: Social History   Socioeconomic History   Marital status: Divorced    Spouse name: Not on file   Number of children: Not on file   Years of education: Not on file   Highest education level: Not on file  Occupational History   Not on file  Tobacco Use   Smoking status: Former    Years: 5    Types: Cigarettes   Smokeless tobacco: Never  Vaping Use   Vaping Use: Never used  Substance and Sexual Activity   Alcohol use: Yes    Comment: rare   Drug use: No   Sexual activity: Not on file  Other  Topics Concern   Not on file  Social History Narrative   Not on file   Social Determinants of Health   Financial Resource Strain: Not on file  Food Insecurity: Not on file  Transportation Needs: Not on file  Physical Activity: Not on file  Stress: Not on file  Social Connections: Not on file  Intimate Partner Violence: Not on file     PHYSICAL  EXAM  Vitals:   01/11/23 1446  BP: (!) 144/98  Pulse: (!) 57  Weight: 229 lb (103.9 kg)  Height: 6' (1.829 m)   Body mass index is 31.06 kg/m.  Generalized: Well developed, in no acute distress  Cardiology: normal rate and rhythm, no murmur auscultated  Respiratory: clear to auscultation bilaterally    Neurological examination  Mentation: Alert oriented to time, place, history taking. Follows all commands speech and language fluent Cranial nerve II-XII: Pupils were equal round reactive to light. Extraocular movements were full, visual field were full on confrontational test. Facial sensation and strength were normal. Uvula tongue midline. Head turning and shoulder shrug  were normal and symmetric. Motor: The motor testing reveals 5 over 5 strength of all 4 extremities. Good symmetric motor tone is noted throughout.   Gait and station: Gait is normal.    DIAGNOSTIC DATA (LABS, IMAGING, TESTING) - I reviewed patient records, labs, notes, testing and imaging myself where available.  Lab Results  Component Value Date   WBC 7.8 12/03/2022   HGB 13.0 12/03/2022   HCT 39.5 12/03/2022   MCV 89.8 12/03/2022   PLT 273 12/03/2022      Component Value Date/Time   NA 142 12/03/2022 1057   NA 144 01/02/2021 0819   K 3.7 12/03/2022 1057   CL 105 12/03/2022 1057   CO2 27 12/03/2022 1057   GLUCOSE 121 (H) 12/03/2022 1057   BUN 42 (H) 12/03/2022 1057   BUN 28 (H) 01/02/2021 0819   CREATININE 1.91 (H) 12/03/2022 1057   CALCIUM 9.7 12/03/2022 1057   PROT 7.1 03/02/2022 0825   PROT 6.9 01/02/2021 0819   ALBUMIN 4.1 03/02/2022 0825    ALBUMIN 4.9 (H) 01/02/2021 0819   AST 21 03/02/2022 0825   ALT 22 03/02/2022 0825   ALKPHOS 70 03/02/2022 0825   BILITOT 0.8 03/02/2022 0825   BILITOT 0.5 01/02/2021 0819   GFRNONAA 29 (L) 12/03/2022 1057   GFRAA >60 05/27/2019 0532   Lab Results  Component Value Date   CHOL 114 01/02/2021   HDL 38 (L) 01/02/2021   LDLCALC 50 01/02/2021   TRIG 155 (H) 01/02/2021   CHOLHDL 3.0 01/02/2021   Lab Results  Component Value Date   HGBA1C 5.9 (H) 01/02/2021   No results found for: "VITAMINB12" No results found for: "TSH"      No data to display               No data to display           ASSESSMENT AND PLAN  62 y.o. year old female  has a past medical history of Anxiety, Arthritis, Dysrhythmia, History of kidney stones, Hypertension, Migraines, Pneumonia, Sleep apnea, Stroke (HCC), and Tuberculosis. here with    Thalamic stroke (HCC) - Plan: Rimegepant Sulfate (NURTEC) 75 MG TBDP  Chronic migraine with aura without status migrainosus, not intractable  Chronic migraine with aura - Plan: Rimegepant Sulfate (NURTEC) 75 MG TBDP, propranolol ER (INDERAL LA) 60 MG 24 hr capsule  Angela Wallace reports migraines have worsened over the past few month. Now occurring daily. We will continue propranolol LA 60mg  daily and add Ajovy injections every 30 days. She will continue baclofen and Nurtec as needed. Triptans contraindicated. Appropriate dosing, storage and possible side effects reviewed. She will continue close follow up with mood management. Manage stressors as appropriate. Healthy lifestyle habits encouraged. She will follow up with PCP as directed. Continue Plavix and rosuvastatin per PCP. Stroke prevention education  given. She will return to see Shanda Bumps in 6 months, sooner if needed. She verbalizes understanding and agreement with this plan.   No orders of the defined types were placed in this encounter.    Meds ordered this encounter  Medications   baclofen  (LIORESAL) 10 MG tablet    Sig: Take 1 tablet (10 mg total) by mouth 2 (two) times daily as needed (migraine headaches).    Dispense:  180 each    Refill:  1    Order Specific Question:   Supervising Provider    Answer:   Anson Fret [[id number]]   Rimegepant Sulfate (NURTEC) 75 MG TBDP    Sig: Take 1 tablet (75 mg total) by mouth daily as needed (For migraine abortive therapy).    Dispense:  8 tablet    Refill:  11    Order Specific Question:   Supervising Provider    Answer:   Anson Fret [[id number]]   propranolol ER (INDERAL LA) 60 MG 24 hr capsule    Sig: Take 1 capsule (60 mg total) by mouth daily.    Dispense:  90 capsule    Refill:  3    Order Specific Question:   Supervising Provider    Answer:   Anson Fret [[id number]]   Fremanezumab-vfrm (AJOVY) 225 MG/1.5ML SOAJ    Sig: Inject 225 mg into the skin every 30 (thirty) days.    Dispense:  4.5 mL    Refill:  3    Order Specific Question:   Supervising Provider    Answer:   Anson Fret [[id number]]     Shawnie Dapper, MSN, FNP-C 01/11/2023, 3:49 PM  [facility] [address] [place], [place] [address] [phone]

## 2023-01-11 ENCOUNTER — Ambulatory Visit: Payer: No Typology Code available for payment source | Admitting: Family Medicine

## 2023-01-11 ENCOUNTER — Encounter: Payer: Self-pay | Admitting: Family Medicine

## 2023-01-11 VITALS — BP 144/98 | HR 57 | Ht 72.0 in | Wt 229.0 lb

## 2023-01-11 DIAGNOSIS — I6381 Other cerebral infarction due to occlusion or stenosis of small artery: Secondary | ICD-10-CM | POA: Diagnosis not present

## 2023-01-11 DIAGNOSIS — G43E09 Chronic migraine with aura, not intractable, without status migrainosus: Secondary | ICD-10-CM

## 2023-01-11 MED ORDER — PROPRANOLOL HCL ER 60 MG PO CP24
60.0000 mg | ORAL_CAPSULE | Freq: Every day | ORAL | 3 refills | Status: DC
Start: 2023-01-11 — End: 2024-01-19

## 2023-01-11 MED ORDER — AJOVY 225 MG/1.5ML ~~LOC~~ SOAJ: 225.0000 mg | SUBCUTANEOUS | 3 refills | Status: DC

## 2023-01-11 MED ORDER — NURTEC 75 MG PO TBDP
75.0000 mg | ORAL_TABLET | Freq: Every day | ORAL | 11 refills | Status: DC | PRN
Start: 2023-01-11 — End: 2024-01-19

## 2023-01-11 MED ORDER — BACLOFEN 10 MG PO TABS: 10.0000 mg | ORAL_TABLET | Freq: Two times a day (BID) | ORAL | 1 refills | Status: DC | PRN

## 2023-01-11 NOTE — Patient Instructions (Addendum)
Below is our plan:  We will continue propranolol LA 60mg  daily. I will add Ajovy injections every 30 days. Continue Nurtec and baclofen as needed. Continue to focus on mood management. Continue Plavix and rosuvastatin per PCP. Seek emergency medical attention for any concerns of stroke.   Please make sure you are staying well hydrated. I recommend 50-60 ounces daily. Well balanced diet and regular exercise encouraged. Consistent sleep schedule with 6-8 hours recommended.   Please continue follow up with care team as directed.   Follow up with Shanda Bumps in 6 months   You may receive a survey regarding today's visit. I encourage you to leave honest feed back as I do use this information to improve patient care. Thank you for seeing me today!   GENERAL HEADACHE INFORMATION:   Natural supplements: Magnesium Oxide or Magnesium Glycinate 500 mg at bed (up to 800 mg daily) Coenzyme Q10 300 mg in AM Vitamin B2- 200 mg twice a day   Add 1 supplement at a time since even natural supplements can have undesirable side effects. You can sometimes buy supplements cheaper (especially Coenzyme Q10) at www.WebmailGuide.co.za or at [employer].   Vitamins and herbs that show potential:   Magnesium: Magnesium (250 mg twice a day or 500 mg at bed) has a relaxant effect on smooth muscles such as blood vessels. Individuals suffering from frequent or daily headache usually have low magnesium levels which can be increase with daily supplementation of 400-750 mg. Three trials found 40-90% average headache reduction  when used as a preventative. Magnesium also demonstrated the benefit in menstrually related migraine.  Magnesium is part of the messenger system in the serotonin cascade and it is a good muscle relaxant.  It is also useful for constipation which can be a side effect of other medications used to treat migraine. Good sources include nuts, whole grains, and tomatoes. Side Effects: loose stool/diarrhea  Riboflavin (vitamin  B 2) 200 mg twice a day. This vitamin assists nerve cells in the production of ATP a principal energy storing molecule.  It is necessary for many chemical reactions in the body.  There have been at least 3 clinical trials of riboflavin using 400 mg per day all of which suggested that migraine frequency can be decreased.  All 3 trials showed significant improvement in over half of migraine sufferers.  The supplement is found in bread, cereal, milk, meat, and poultry.  Most Americans get more riboflavin than the recommended daily allowance, however riboflavin deficiency is not necessary for the supplements to help prevent headache. Side effects: energizing, green urine   Coenzyme Q10: This is present in almost all cells in the body and is critical component for the conversion of energy.  Recent studies have shown that a nutritional supplement of CoQ10 can reduce the frequency of migraine attacks by improving the energy production of cells as with riboflavin.  Doses of 150 mg twice a day have been shown to be effective.   Melatonin: Increasing evidence shows correlation between melatonin secretion and headache conditions.  Melatonin supplementation has decreased headache intensity and duration.  It is widely used as a sleep aid.  Sleep is natures way of dealing with migraine.  A dose of 3 mg is recommended to start for headaches including cluster headache. Higher doses up to 15 mg has been reviewed for use in Cluster headache and have been used. The rationale behind using melatonin for cluster is that many theories regarding the cause of Cluster headache center around the  disruption of the normal circadian rhythm in the brain.  This helps restore the normal circadian rhythm.   HEADACHE DIET: Foods and beverages which may trigger migraine Note that only 20% of headache patients are food sensitive. You will know if you are food sensitive if you get a headache consistently 20 minutes to 2 hours after eating a  certain food. Only cut out a food if it causes headaches, otherwise you might remove foods you enjoy! What matters most for diet is to eat a well balanced healthy diet full of vegetables and low fat protein, and to not miss meals.   Chocolate, other sweets ALL cheeses except cottage and cream cheese Dairy products, yogurt, sour cream, ice cream Liver Meat extracts (Bovril, Marmite, meat tenderizers) Meats or fish which have undergone aging, fermenting, pickling or smoking. These include: Hotdogs,salami,Lox,sausage, mortadellas,smoked salmon, pepperoni, Pickled herring Pods of broad bean (English beans, Chinese pea pods, [place] (fava) beans, lima and navy beans Ripe avocado, ripe banana Yeast extracts or active yeast preparations such as Brewer's or Fleishman's (commercial bakes goods are permitted) Tomato based foods, pizza (lasagna, etc.)   MSG (monosodium glutamate) is disguised as many things; look for these common aliases: Monopotassium glutamate Autolysed yeast Hydrolysed protein Sodium caseinate "flavorings" "all natural preservatives" Nutrasweet   Avoid all other foods that convincingly provoke headaches.   Resources: The Dizzy [place] Laundry Your Headache Diet, migrainestrong.com  https://zamora-andrews.com/   Caffeine and Migraine For patients that have migraine, caffeine intake more than 3 days per week can lead to dependency and increased migraine frequency. I would recommend cutting back on your caffeine intake as best you can. The recommended amount of caffeine is 200-300 mg daily, although migraine patients may experience dependency at even lower doses. While you may notice an increase in headache temporarily, cutting back will be helpful for headaches in the long run. For more information on caffeine and migraine, visit: https://americanmigrainefoundation.org/resource-library/caffeine-and-migraine/   Headache Prevention  Strategies:   1. Maintain a headache diary; learn to identify and avoid triggers.  - This can be a simple note where you log when you had a headache, associated symptoms, and medications used - There are several smartphone apps developed to help track migraines: Migraine Buddy, Migraine Monitor, Curelator N1-Headache App   Common triggers include: Emotional triggers: Emotional/Upset family or friends Emotional/Upset occupation Business reversal/success Anticipation anxiety Crisis-serious Post-crisis periodNew job/position   Physical triggers: Vacation Day Weekend Strenuous Exercise High Altitude Location New Move Menstrual Day Physical Illness Oversleep/Not enough sleep Weather changes Light: Photophobia or light sesnitivity treatment involves a balance between desensitization and reduction in overly strong input. Use dark polarized glasses outside, but not inside. Avoid bright or fluorescent light, but do not dim environment to the point that going into a normally lit room hurts. Consider FL-41 tint lenses, which reduce the most irritating wavelengths without blocking too much light.  These can be obtained at axonoptics.com or theraspecs.com Foods: see list above.   2. Limit use of acute treatments (over-the-counter medications, triptans, etc.) to no more than 2 days per week or 10 days per month to prevent medication overuse headache (rebound headache).     3. Follow a regular schedule (including weekends and holidays): Don't skip meals. Eat a balanced diet. 8 hours of sleep nightly. Minimize stress. Exercise 30 minutes per day. Being overweight is associated with a 5 times increased risk of chronic migraine. Keep well hydrated and drink 6-8 glasses of water per day.   4. Initiate non-pharmacologic measures at  the earliest onset of your headache. Rest and quiet environment. Relax and reduce stress. Breathe2Relax is a free app that can instruct you on    some simple relaxtion  and breathing techniques. Http://Dawnbuse.com is a    free website that provides teaching videos on relaxation.  Also, there are  many apps that   can be downloaded for "mindful" relaxation.  An app called YOGA NIDRA will help walk you through mindfulness. Another app called Calm can be downloaded to give you a structured mindfulness guide with daily reminders and skill development. Headspace for guided meditation Mindfulness Based Stress Reduction Online Course: www.palousemindfulness.com Cold compresses.   5. Don't wait!! Take the maximum allowable dosage of prescribed medication at the first sign of migraine.   6. Compliance:  Take prescribed medication regularly as directed and at the first sign of a migraine.   7. Communicate:  Call your physician when problems arise, especially if your headaches change, increase in frequency/severity, or become associated with neurological symptoms (weakness, numbness, slurred speech, etc.).   8. Headache/pain management therapies: Consider various complementary methods, including medication, behavioral therapy, psychological counselling, biofeedback, massage therapy, acupuncture, dry needling, and other modalities.  Such measures may reduce the need for medications. Counseling for pain management, where patients learn to function and ignore/minimize their pain, seems to work very well.   9. Recommend changing family's attention and focus away from patient's headaches. Instead, emphasize daily activities. If first question of day is 'How are your headaches/Do you have a headache today?', then patient will constantly think about headaches, thus making them worse. Goal is to re-direct attention away from headaches, toward daily activities and other distractions.   10. Helpful Websites: www.AmericanHeadacheSociety.org PatentHood.ch www.headaches.org TightMarket.nl www.achenet.org

## 2023-01-13 ENCOUNTER — Other Ambulatory Visit (HOSPITAL_COMMUNITY): Payer: No Typology Code available for payment source

## 2023-01-14 ENCOUNTER — Other Ambulatory Visit (HOSPITAL_COMMUNITY): Payer: Self-pay

## 2023-01-14 ENCOUNTER — Telehealth: Payer: Self-pay

## 2023-01-14 NOTE — Telephone Encounter (Signed)
Pharmacy Patient Advocate Encounter   Received notification that prior authorization for AJOVY (fremanezumab-vfrm) injection 225MG /1.5ML auto-injectors is required/requested.   PA submitted on 01/14/2023 to (ins) [employer] via [employer] or (Medicaid) confirmation # [id number] Status is pending

## 2023-01-14 NOTE — Telephone Encounter (Signed)
Pharmacy Patient Advocate Encounter   Received notification from [employer] that prior authorization for Nurtec 75MG  dispersible tablets is required/requested.   PA submitted on 01/14/2023 to (ins) [employer] via [employer] or (Medicaid) confirmation # [id number] Status is pending

## 2023-01-15 ENCOUNTER — Other Ambulatory Visit (HOSPITAL_COMMUNITY): Payer: Self-pay

## 2023-01-15 ENCOUNTER — Encounter (HOSPITAL_COMMUNITY)
Admission: RE | Admit: 2023-01-15 | Discharge: 2023-01-15 | Disposition: A | Discharge status: Home / Self Care | Payer: No Typology Code available for payment source | Source: Ambulatory Visit | Attending: Internal Medicine | Admitting: Internal Medicine

## 2023-01-15 ENCOUNTER — Encounter (HOSPITAL_COMMUNITY): Payer: Self-pay

## 2023-01-15 ENCOUNTER — Other Ambulatory Visit: Payer: Self-pay

## 2023-01-15 NOTE — Telephone Encounter (Signed)
Insurance requested additional information-faxed completed forms along with clinical notes to [phone].

## 2023-01-15 NOTE — Telephone Encounter (Signed)
Pharmacy Patient Advocate Encounter  Prior Authorization for Nurtec ODT 75MG  Tablet has been approved by [employer] (ins).    PA # EOC ID# [id number] Effective dates: 01/15/2023 through 01/15/2024  Copay is $0 per test claim.

## 2023-01-15 NOTE — Telephone Encounter (Signed)
Insurance requested additional information-faxed forms along with clinical information over to [phone].

## 2023-01-15 NOTE — Telephone Encounter (Signed)
Pharmacy Patient Advocate Encounter  Prior Authorization for Ajovy 225 MG/1.5 ML Auto Injector has been approved by [employer] (ins).    PA # EOC ID: [id number] Effective dates: 01/15/2023 through 01/15/2024  Copay is $24.98 per test claim.

## 2023-01-18 ENCOUNTER — Other Ambulatory Visit: Payer: Self-pay | Admitting: *Deleted

## 2023-01-18 MED ORDER — PEG 3350-KCL-NA BICARB-NACL 420 G PO SOLR: 4000.0000 mL | Freq: Once | ORAL | 0 refills | Status: AC

## 2023-01-20 ENCOUNTER — Telehealth: Payer: Self-pay | Admitting: Gastroenterology

## 2023-01-20 ENCOUNTER — Encounter (HOSPITAL_COMMUNITY)
Admission: RE | Disposition: A | Discharge status: Home / Self Care | Payer: Self-pay | Source: Home / Self Care | Attending: Internal Medicine

## 2023-01-20 ENCOUNTER — Ambulatory Visit (HOSPITAL_COMMUNITY): Payer: No Typology Code available for payment source | Admitting: Anesthesiology

## 2023-01-20 ENCOUNTER — Ambulatory Visit (HOSPITAL_BASED_OUTPATIENT_CLINIC_OR_DEPARTMENT_OTHER): Payer: No Typology Code available for payment source | Admitting: Anesthesiology

## 2023-01-20 ENCOUNTER — Encounter (HOSPITAL_COMMUNITY): Payer: Self-pay | Admitting: Internal Medicine

## 2023-01-20 ENCOUNTER — Ambulatory Visit (HOSPITAL_COMMUNITY)
Admission: RE | Admit: 2023-01-20 | Discharge: 2023-01-20 | Disposition: A | Discharge status: Home / Self Care | Payer: No Typology Code available for payment source | Attending: Internal Medicine | Admitting: Internal Medicine

## 2023-01-20 DIAGNOSIS — K5730 Diverticulosis of large intestine without perforation or abscess without bleeding: Secondary | ICD-10-CM | POA: Diagnosis not present

## 2023-01-20 DIAGNOSIS — Z87891 Personal history of nicotine dependence: Secondary | ICD-10-CM | POA: Diagnosis not present

## 2023-01-20 DIAGNOSIS — I10 Essential (primary) hypertension: Secondary | ICD-10-CM | POA: Insufficient documentation

## 2023-01-20 DIAGNOSIS — G4730 Sleep apnea, unspecified: Secondary | ICD-10-CM | POA: Insufficient documentation

## 2023-01-20 DIAGNOSIS — Z8673 Personal history of transient ischemic attack (TIA), and cerebral infarction without residual deficits: Secondary | ICD-10-CM | POA: Insufficient documentation

## 2023-01-20 DIAGNOSIS — Z800 Family history of malignant neoplasm of digestive organs: Secondary | ICD-10-CM

## 2023-01-20 DIAGNOSIS — K644 Residual hemorrhoidal skin tags: Secondary | ICD-10-CM | POA: Diagnosis not present

## 2023-01-20 DIAGNOSIS — K642 Third degree hemorrhoids: Secondary | ICD-10-CM

## 2023-01-20 DIAGNOSIS — Z1211 Encounter for screening for malignant neoplasm of colon: Secondary | ICD-10-CM | POA: Diagnosis present

## 2023-01-20 DIAGNOSIS — Z7902 Long term (current) use of antithrombotics/antiplatelets: Secondary | ICD-10-CM | POA: Diagnosis not present

## 2023-01-20 HISTORY — PX: COLONOSCOPY WITH PROPOFOL: SHX5780

## 2023-01-20 SURGERY — COLONOSCOPY WITH PROPOFOL
Anesthesia: General

## 2023-01-20 MED ORDER — PROPOFOL 500 MG/50ML IV EMUL
INTRAVENOUS | Status: DC | PRN
  Administered 2023-01-20: 100 ug/kg/min via INTRAVENOUS

## 2023-01-20 MED ORDER — PROPOFOL 10 MG/ML IV BOLUS
INTRAVENOUS | Status: DC | PRN
  Administered 2023-01-20: 50 mg via INTRAVENOUS
  Administered 2023-01-20 (×2): 20 mg via INTRAVENOUS
  Administered 2023-01-20: 10 mg via INTRAVENOUS

## 2023-01-20 MED ORDER — LACTATED RINGERS IV SOLN: INTRAVENOUS | Status: DC

## 2023-01-20 MED ORDER — LACTATED RINGERS IV SOLN: INTRAVENOUS | Status: DC | PRN

## 2023-01-20 NOTE — Telephone Encounter (Signed)
Referral placed.

## 2023-01-20 NOTE — Op Note (Signed)
[facility] Patient Name: Angela Wallace Procedure Date: 01/20/2023 9:11 AM MRN: [id number] Date of Birth: [birth year] Attending MD: Gennette Pac , MD, [id number] CSN: [id number] Age: 62 Admit Type: Outpatient Procedure:                Colonoscopy Indications:              Screening patient at increased risk: Family history                            of colorectal cancer in multiple 1st-degree                            relatives Providers:                Gennette Pac, MD, Buel Ream. Thomasena Edis RN, RN,                            Lennice Sites Technician, Technician Referring MD:              Medicines:                Propofol per Anesthesia Complications:            No immediate complications. Estimated Blood Loss:     Estimated blood loss: none. Procedure:                Pre-Anesthesia Assessment:                           - Prior to the procedure, a History and Physical                            was performed, and patient medications and                            allergies were reviewed. The patient's tolerance of                            previous anesthesia was also reviewed. The risks                            and benefits of the procedure and the sedation                            options and risks were discussed with the patient.                            All questions were answered, and informed consent                            was obtained. Prior Anticoagulants: The patient has                            taken no anticoagulant or antiplatelet agents. ASA  Grade Assessment: III - A patient with severe                            systemic disease. After reviewing the risks and                            benefits, the patient was deemed in satisfactory                            condition to undergo the procedure.                           After obtaining informed consent, the colonoscope                            was passed under  direct vision. Throughout the                            procedure, the patient's blood pressure, pulse, and                            oxygen saturations were monitored continuously. The                            [id number]) scope was introduced through the                            anus and advanced to the the cecum, identified by                            appendiceal orifice and ileocecal valve. The                            colonoscopy was performed without difficulty. The                            patient tolerated the procedure well. The quality                            of the bowel preparation was adequate. The                            ileocecal valve, appendiceal orifice, and rectum                            were photographed. Scope In: 9:39:29 AM Scope Out: 9:55:51 AM Scope Withdrawal Time: 0 hours 8 minutes 36 seconds  Total Procedure Duration: 0 hours 16 minutes 22 seconds  Findings:      Hemorrhoids were found on perianal exam.      External and internal hemorrhoids were found during retroflexion. The       hemorrhoids were moderate, medium-sized and Grade III (internal       hemorrhoids that prolapse but require manual reduction).      Scattered small-mouthed diverticula were found in the  sigmoid colon and       descending colon.      The exam was otherwise without abnormality on direct and retroflexion       views. Impression:               - Hemorrhoids found on perianal exam.                           - External and internal hemorrhoids.                           - Diverticulosis in the sigmoid colon and in the                            descending colon.                           - The examination was otherwise normal on direct                            and retroflexion views.                           - No specimens collected. Moderate Sedation:      Moderate (conscious) sedation was personally administered by an       anesthesia professional. The  following parameters were monitored: oxygen       saturation, heart rate, blood pressure, respiratory rate, EKG, adequacy       of pulmonary ventilation, and response to care. Recommendation:           - Patient has a contact number available for                            emergencies. The signs and symptoms of potential                            delayed complications were discussed with the                            patient. Return to normal activities tomorrow.                            Written discharge instructions were provided to the                            patient.                           - Advance diet as tolerated.                           - Continue present medications.                           - Repeat colonoscopy in 5 years for screening  purposes. Needs genetic testing. May need closer                            interval follow-up colonoscopy.                           - Return to GI office in 3 months. Information on                            hemorrhoids diverticulosis provided. Hemorrhoid                            banding pamphlet provided. Office visit with [place] in                            3 months Procedure Code(s):        --- Professional ---                           [phone], Colonoscopy, flexible; diagnostic, including                            collection of specimen(s) by brushing or washing,                            when performed (separate procedure) Diagnosis Code(s):        --- Professional ---                           Z80.0, Family history of malignant neoplasm of                            digestive organs                           K64.2, Third degree hemorrhoids                           K57.30, Diverticulosis of large intestine without                            perforation or abscess without bleeding CPT copyright 2022 American Medical Association. All rights reserved. The codes documented in this report are  preliminary and upon coder review may  be revised to meet current compliance requirements. Gerrit Friends. Chiyeko Ferre, MD Gennette Pac, MD 01/20/2023 10:10:38 AM This report has been signed electronically. Number of Addenda: 0

## 2023-01-20 NOTE — Anesthesia Postprocedure Evaluation (Signed)
Anesthesia Post Note  Patient: [name]  Procedure(s) Performed: COLONOSCOPY WITH PROPOFOL  Patient location during evaluation: Phase II Anesthesia Type: General Level of consciousness: awake and alert and oriented Pain management: pain level controlled Vital Signs Assessment: post-procedure vital signs reviewed and stable Respiratory status: spontaneous breathing, nonlabored ventilation and respiratory function stable Cardiovascular status: blood pressure returned to baseline and stable Postop Assessment: no apparent nausea or vomiting Anesthetic complications: no  No notable events documented.   Last Vitals:  Vitals:   01/20/23 0737 01/20/23 1001  BP: (!) 158/85 (!) 115/55  Pulse: (!) 54 (!) 54  Resp:  14  Temp: 36.9 C 36.5 C  SpO2: 96% 95%    Last Pain:  Vitals:   01/20/23 1001  TempSrc: Axillary  PainSc: 0-No pain                 Kenyotta Dorfman C Dinisha Cai

## 2023-01-20 NOTE — Telephone Encounter (Signed)
Patient with very strong family history of colon cancer. She needs genetic testing performed. Can we get her referred to a genetic counselor for testing.   Brooke Bonito, MSN, APRN, FNP-BC, AGACNP-BC [facility]

## 2023-01-20 NOTE — Anesthesia Preprocedure Evaluation (Addendum)
Anesthesia Evaluation  Patient identified by MRN, date of birth, ID band Patient awake    Reviewed: Allergy & Precautions, H&P , NPO status , Patient's Chart, lab work & pertinent test results, reviewed documented beta blocker date and time   Airway Mallampati: II  TM Distance: >3 FB Neck ROM: Full    Dental  (+) Dental Advisory Given No notable dental injury:   Pulmonary sleep apnea and Continuous Positive Airway Pressure Ventilation , pneumonia, former smoker   Pulmonary exam normal breath sounds clear to auscultation       Cardiovascular Exercise Tolerance: Good hypertension, Pt. on medications and Pt. on home beta blockers Normal cardiovascular exam+ dysrhythmias  Rhythm:Regular Rate:Normal     Neuro/Psych  Headaches PSYCHIATRIC DISORDERS Anxiety Depression    CVA, No Residual Symptoms    GI/Hepatic negative GI ROS, Neg liver ROS,,,  Endo/Other  negative endocrine ROS    Renal/GU negative Renal ROS  negative genitourinary   Musculoskeletal  (+) Arthritis ,    Abdominal   Peds negative pediatric ROS (+)  Hematology negative hematology ROS (+)   Anesthesia Other Findings   Reproductive/Obstetrics negative OB ROS                             Anesthesia Physical Anesthesia Plan  ASA: 3  Anesthesia Plan: General   Post-op Pain Management: Minimal or no pain anticipated   Induction: Intravenous  PONV Risk Score and Plan: 1 and Treatment may vary due to age or medical condition  Airway Management Planned: Nasal Cannula and Natural Airway  Additional Equipment:   Intra-op Plan:   Post-operative Plan:   Informed Consent: I have reviewed the patients History and Physical, chart, labs and discussed the procedure including the risks, benefits and alternatives for the proposed anesthesia with the patient or authorized representative who has indicated his/her understanding and  acceptance.     Dental advisory given  Plan Discussed with: CRNA and Surgeon  Anesthesia Plan Comments:        Anesthesia Quick Evaluation

## 2023-01-20 NOTE — Transfer of Care (Addendum)
Immediate Anesthesia Transfer of Care Note  Patient: Angela Wallace  Procedure(s) Performed: COLONOSCOPY WITH PROPOFOL  Patient Location: PACU  Anesthesia Type:General  Level of Consciousness: drowsy and patient cooperative  Airway & Oxygen Therapy: Patient Spontanous Breathing  Post-op Assessment: Report given to RN and Post -op Vital signs reviewed and stable  Post vital signs: Reviewed and stable  Last Vitals:  Vitals Value Taken Time  BP 115/55 01/20/23   1001  Temp 36.5 01/20/23   1001  Pulse 54 01/20/23   1001  Resp 14 01/20/23   1001  SpO2 95% 01/20/23   1001    Last Pain:  Vitals:   01/20/23 0932  TempSrc:   PainSc: 3       Patients Stated Pain Goal: 5 (01/20/23 0737)  Complications: No notable events documented.

## 2023-01-20 NOTE — Addendum Note (Signed)
Addended by: Armstead Peaks on: 01/20/2023 12:12 PM   Modules accepted: Orders

## 2023-01-20 NOTE — Discharge Instructions (Addendum)
  Colonoscopy Discharge Instructions  Read the instructions outlined below and refer to this sheet in the next few weeks. These discharge instructions provide you with general information on caring for yourself after you leave the hospital. Your doctor may also give you specific instructions. While your treatment has been planned according to the most current medical practices available, unavoidable complications occasionally occur. If you have any problems or questions after discharge, call Dr. Jena Gauss at [phone]. ACTIVITY You may resume your regular activity, but move at a slower pace for the next 24 hours.  Take frequent rest periods for the next 24 hours.  Walking will help get rid of the air and reduce the bloated feeling in your belly (abdomen).  No driving for 24 hours (because of the medicine (anesthesia) used during the test).   Do not sign any important legal documents or operate any machinery for 24 hours (because of the anesthesia used during the test).  NUTRITION Drink plenty of fluids.  You may resume your normal diet as instructed by your doctor.  Begin with a light meal and progress to your normal diet. Heavy or fried foods are harder to digest and may make you feel sick to your stomach (nauseated).  Avoid alcoholic beverages for 24 hours or as instructed.  MEDICATIONS You may resume your normal medications unless your doctor tells you otherwise.  WHAT YOU CAN EXPECT TODAY Some feelings of bloating in the abdomen.  Passage of more gas than usual.  Spotting of blood in your stool or on the toilet paper.  IF YOU HAD POLYPS REMOVED DURING THE COLONOSCOPY: No aspirin products for 7 days or as instructed.  No alcohol for 7 days or as instructed.  Eat a soft diet for the next 24 hours.  FINDING OUT THE RESULTS OF YOUR TEST Not all test results are available during your visit. If your test results are not back during the visit, make an appointment with your caregiver to find out the  results. Do not assume everything is normal if you have not heard from your caregiver or the medical facility. It is important for you to follow up on all of your test results.  SEEK IMMEDIATE MEDICAL ATTENTION IF: You have more than a spotting of blood in your stool.  Your belly is swollen (abdominal distention).  You are nauseated or vomiting.  You have a temperature over 101.  You have abdominal pain or discomfort that is severe or gets worse throughout the day.    You had no polyps today.  You have internal and external hemorrhoids.  You have diverticulosis.    It is recommended you have a repeat colonoscopy no later than 5 years from now.      I also recommend you undergo genetic testing.  My office will set up.        Information on hemorrhoids, and diverticulosis provided pamphlet on hemorrhoid banding provided  Office visit with Brooke Bonito in 3 months   at patient request, I called Rigoberto Noel at [phone]  -  rolled to voicemail.  Left a message.

## 2023-01-20 NOTE — H&P (Signed)
@LOGO @   Primary Care Physician:  Elfredia Nevins, MD Primary Gastroenterologist:  Dr. Jena Gauss  Pre-Procedure History & Physical: HPI:  Angela Wallace is a 62 y.o. female is here for a screening colonoscopy.   Multiple (3) for scree relative with colon cancer.  Last colonoscopy at age 77.  No bowel symptoms at this time.  Here for high risk screening.  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 Authorizing Provider  baclofen (LIORESAL) 10 MG tablet Take 1 tablet (10 mg total) by mouth 2 (two) times daily as needed (migraine headaches). 01/11/23  Yes Lomax, Amy, NP  Bempedoic Acid (NEXLETOL) 180 MG TABS Take 180 mg by mouth at bedtime.   Yes [provider]  CALCIUM PO Take 1 tablet by mouth daily.   Yes [provider]  Cholecalciferol (VITAMIN D3 PO) Take 1 tablet by mouth daily.   Yes [provider]  clopidogrel (PLAVIX) 75 MG tablet Take 1 tablet (75 mg total) by mouth daily. 03/13/22  Yes Joen Laura, MD  Coenzyme Q10 (COQ10) 200 MG CAPS Take 200 mg by mouth daily.    Yes [provider]  FLUoxetine (PROZAC) 40 MG capsule Take 40 mg by mouth daily.   Yes [provider]  gabapentin (NEURONTIN) 300 MG capsule Take 600 mg by mouth 2 (two) times daily.   Yes [provider]   hydrochlorothiazide (MICROZIDE) 12.5 MG capsule Take 12.5 mg by mouth daily. 01/30/22  Yes [provider]  LORazepam (ATIVAN) 0.5 MG tablet Take 0.5 mg by mouth as needed. Dose unknown   Yes [provider]  losartan (COZAAR) 50 MG tablet Take 50 mg by mouth daily. 06/27/21  Yes [provider]  MAGNESIUM PO Take 1 tablet by mouth daily.   Yes [provider]  Omega-3 Fatty Acids (FISH OIL PO) Take 1 capsule by mouth 2 (two) times daily.   Yes [provider]  propranolol ER (INDERAL LA) 60 MG 24 hr capsule Take 1 capsule (60 mg total) by mouth daily. 01/11/23  Yes Lomax, Amy, NP  rosuvastatin (CRESTOR) 20 MG tablet TAKE ONE TABLET (20MG  TOTAL) BY MOUTH DAILY 04/21/22  Yes Ihor Austin, NP  aspirin EC 81 MG tablet Take by mouth. 07/14/17   [provider]  Fremanezumab-vfrm (AJOVY) 225 MG/1.5ML SOAJ Inject 225 mg into the skin every 30 (thirty) days. Patient taking differently: Inject 225 mg into the skin every 30 (thirty) days. Hasn't started yet 01/11/23   Lomax, Amy, NP  Rimegepant Sulfate (NURTEC) 75 MG TBDP Take 1 tablet (75 mg total) by mouth daily as needed (For migraine abortive therapy). 01/11/23   Shawnie Dapper, NP  Allergies as of 12/14/2022 - Review Complete 12/14/2022  Allergen Reaction Noted   Chocolate Other (See Comments) 05/26/2019   Citrus Other (See Comments) 05/26/2019   Codeine Other (See Comments) 06/29/2016   Lipitor [atorvastatin] Other (See Comments) 09/27/2019   Nitrates, organic Other (See Comments) 05/26/2019   Peanut-containing drug products Other (See Comments) 05/26/2019   Pineapple Other (See Comments) 05/26/2019    Family History  Problem Relation Age of Onset   Heart failure Mother    Heart failure Father    Hypertension Other     Social History   Socioeconomic History   Marital status: Divorced    Spouse name: Not on file   Number of children: Not on file   Years of education: Not on file    Highest education level: Not on file  Occupational History   Not on file  Tobacco Use   Smoking status: Former    Packs/day: 0.25    Years: 5.00    Additional pack years: 0.00    Total pack years: 1.25    Types: Cigarettes    Quit date: 01/02/2010    Years since quitting: 13.0   Smokeless tobacco: Never  Vaping Use   Vaping Use: Never used  Substance and Sexual Activity   Alcohol use: Not Currently   Drug use: No   Sexual activity: Not on file  Other Topics Concern   Not on file  Social History Narrative   Not on file   Social Determinants of Health   Financial Resource Strain: Not on file  Food Insecurity: Not on file  Transportation Needs: Not on file  Physical Activity: Not on file  Stress: Not on file  Social Connections: Not on file  Intimate Partner Violence: Not on file    Review of Systems: See HPI, otherwise negative ROS  Physical Exam: BP (!) 158/85   Pulse (!) 54   Temp 98.4 F (36.9 C) (Oral)   Ht 6' (1.829 m)   Wt 100.2 kg   SpO2 96%   BMI 29.97 kg/m  General:   Alert,  Well-developed, well-nourished, pleasant and cooperative in NAD Neck:  Supple; no masses or thyromegaly. Lungs:  Clear throughout to auscultation.   No wheezes, crackles, or rhonchi. No acute distress. Heart:  Regular rate and rhythm; no murmurs, clicks, rubs,  or gallops. Abdomen:  Soft, nontender and nondistended. No masses, hepatosplenomegaly or hernias noted. Normal bowel sounds, without guarding, and without rebound.   Psych:  Alert and cooperative. Normal mood and affect.  Impression/Plan: Angela Wallace is now here to undergo a screening colonoscopy.   this is high risk screening.  Patient far overdue for screening.  Unsure if she is ever had genetic testing.  I will offer the patient a screening colonoscopy today. .rmr  Risks, benefits, limitations, imponderables and alternatives regarding colonoscopy have been reviewed with the patient. Questions have been answered.  All parties agreeable.     Notice:  This dictation was prepared with Dragon dictation along with smaller phrase technology. Any transcriptional errors that result from this process are unintentional and may not be corrected upon review.

## 2023-01-27 ENCOUNTER — Encounter (HOSPITAL_COMMUNITY): Payer: Self-pay | Admitting: Internal Medicine

## 2023-03-08 ENCOUNTER — Encounter: Payer: Self-pay | Admitting: Genetic Counselor

## 2023-03-09 ENCOUNTER — Inpatient Hospital Stay: Payer: No Typology Code available for payment source

## 2023-03-09 ENCOUNTER — Inpatient Hospital Stay: Payer: No Typology Code available for payment source | Admitting: Genetic Counselor

## 2023-04-13 ENCOUNTER — Other Ambulatory Visit: Payer: Self-pay

## 2023-04-13 ENCOUNTER — Other Ambulatory Visit: Payer: Self-pay | Admitting: Genetic Counselor

## 2023-04-13 ENCOUNTER — Encounter: Payer: Self-pay | Admitting: Genetic Counselor

## 2023-04-13 ENCOUNTER — Inpatient Hospital Stay: Payer: No Typology Code available for payment source | Attending: Genetic Counselor | Admitting: Genetic Counselor

## 2023-04-13 ENCOUNTER — Inpatient Hospital Stay: Payer: No Typology Code available for payment source

## 2023-04-13 DIAGNOSIS — Z800 Family history of malignant neoplasm of digestive organs: Secondary | ICD-10-CM | POA: Diagnosis not present

## 2023-04-13 DIAGNOSIS — Z803 Family history of malignant neoplasm of breast: Secondary | ICD-10-CM | POA: Diagnosis not present

## 2023-04-13 LAB — GENETIC SCREENING ORDER

## 2023-04-13 NOTE — Progress Notes (Signed)
REFERRING PROVIDER: Aida Raider, NP [address] [place],  [place] [address]  PRIMARY PROVIDER:  Elfredia Nevins, MD  PRIMARY REASON FOR VISIT:  1. Family history of colon cancer   2. Family history of breast cancer      HISTORY OF PRESENT ILLNESS:   [name], a 62 y.o. female, was seen for a  cancer genetics consultation at the request of Dr. Boris Lown due to a family history of cancer.  [name] presents to clinic today to discuss the possibility of a hereditary predisposition to cancer, genetic testing, and to further clarify her future cancer risks, as well as potential cancer risks for family members.   [name] is a 62 y.o. female with no personal history of cancer.  She reports having a normal colonoscopy.  CANCER HISTORY:  Oncology History   No history exists.     RISK FACTORS:  Menarche was at age 89.  First live birth at age 66.  OCP use for approximately  5-10  years.  Ovaries intact: yes.  Hysterectomy: no.  Menopausal status: postmenopausal.  HRT use: 0 years. Colonoscopy: yes; normal. Mammogram within the last year: yes. Number of breast biopsies: 0. Up to date with pelvic exams: no. Any excessive radiation exposure in the past: no  Past Medical History:  Diagnosis Date   Anxiety    Arthritis    Dysrhythmia    Family history of breast cancer    Family history of colon cancer    History of kidney stones    Hypertension    Migraines    Pneumonia    Sleep apnea    Stroke [facility])    Tuberculosis    test positive, never had    Past Surgical History:  Procedure Laterality Date   ABDOMINAL HYSTERECTOMY     COLONOSCOPY WITH PROPOFOL N/A 01/20/2023   Procedure: COLONOSCOPY WITH PROPOFOL;  Surgeon: Corbin Ade, MD;  [facility];  Service: Endoscopy;  Laterality: N/A;  9:00 am   CYST EXCISION     left knee   KNEE ARTHROSCOPY Left    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   Procedure: TOTAL KNEE ARTHROPLASTY;   Surgeon: Joen Laura, MD;  Location: [facility];  Service: Orthopedics;  Laterality: Left;   WISDOM TOOTH EXTRACTION      Social History   Socioeconomic History   Marital status: Divorced    Spouse name: Not on file   Number of children: Not on file   Years of education: Not on file   Highest education level: Not on file  Occupational History   Not on file  Tobacco Use   Smoking status: Former    Current packs/day: 0.00    Average packs/day: 0.3 packs/day for 5.0 years (1.3 ttl pk-yrs)    Types: Cigarettes    Start date: 01/02/2005    Quit date: 01/02/2010    Years since quitting: 13.2   Smokeless tobacco: Never  Vaping Use   Vaping status: Never Used  Substance and Sexual Activity   Alcohol use: Not Currently   Drug use: No   Sexual activity: Not on file  Other Topics Concern   Not on file  Social History Narrative   Not on file   Social Determinants of Health   Financial Resource Strain: Not on file  Food Insecurity: Not on file  Transportation Needs: Not on file  Physical Activity: Not on file  Stress: Not on file  Social Connections: Not on file  FAMILY HISTORY:  We obtained a detailed, 4-generation family history.  Significant diagnoses are listed below: Family History  Problem Relation Age of Onset   Heart failure Mother    Heart failure Father    Colon cancer Father 50   Colon cancer Brother 52   Colon cancer Maternal Grandfather    Colon cancer Son 37   Hypertension Other    Breast cancer Cousin        dx. > 50, mat first cousin     The patient has two sons, one who was diagnosed with colon cancer at 47.  She has two brothers, one who was diagnosed with colon cancer at 74.  Both parents are deceased.    The patient's father was diagnosed with colon cancer at 57.  He had three brothers and two sisters who are cancer free. His parents are deceased.  The patient's mother died at 51 from congestive heart failure.  She had seven siblings who are  cancer free.  One sister had a daughter with breast cancer.  The patient's maternal grandfather was diagnosed with colon cancer.  [name] is unaware of previous family history of genetic testing for hereditary cancer risks, although she suspects that her son has had genetic testing. Patient's maternal ancestors are of [place] descent, and paternal ancestors are of [place] descent. There is no reported Ashkenazi Jewish ancestry. There is no known consanguinity.  GENETIC COUNSELING ASSESSMENT: [name] is a 62 y.o. female with a family history of colon cancer which is somewhat suggestive of a hereditary cancer syndrome, such as Lynch syndrome, and predisposition to cancer given the number of cases of colon cancer and the young age of onset.. We, therefore, discussed and recommended the following at today's visit.   DISCUSSION: We discussed that, in general, most cancer is not inherited in families, but instead is sporadic or familial. Sporadic cancers occur by chance and typically happen at older ages (>50 years) as this type of cancer is caused by genetic changes acquired during an individual's lifetime. Some families have more cancers than would be expected by chance; however, the ages or types of cancer are not consistent with a known genetic mutation or known genetic mutations have been ruled out. This type of familial cancer is thought to be due to a combination of multiple genetic, environmental, hormonal, and lifestyle factors. While this combination of factors likely increases the risk of cancer, the exact source of this risk is not currently identifiable or testable.  We discussed that 5 - 7% of colon cancer is hereditary, with most cases associated with Lynch syndrome.  There are other genes that can be associated with hereditary colon cancer syndromes.  These include APC, MUTYH, BMPR1A and a few others.  We discussed that testing is beneficial for several reasons including knowing how to  follow individuals after completing their treatment, identifying whether potential treatment options such as PARP inhibitors would be beneficial, and understand if other family members could be at risk for cancer and allow them to undergo genetic testing.   We reviewed the characteristics, features and inheritance patterns of hereditary cancer syndromes. We also discussed genetic testing, including the appropriate family members to test, the process of testing, insurance coverage and turn-around-time for results. We discussed the implications of a negative, positive, carrier and/or variant of uncertain significant result. [name]  was offered a common hereditary cancer panel (47 genes) and an expanded pan-cancer panel (77 genes). [name] was informed of the benefits  and limitations of each panel, including that expanded pan-cancer panels contain genes that do not have clear management guidelines at this point in time.  We also discussed that as the number of genes included on a panel increases, the chances of variants of uncertain significance increases. [name] decided to pursue genetic testing for the CancerNext-Expanded+RNAinsight gene panel.   The CancerNext-Expanded gene panel offered by [facility] and includes sequencing and rearrangement analysis for the following 77 genes: AIP, ALK, APC*, ATM*, AXIN2, BAP1, BARD1, BMPR1A, BRCA1*, BRCA2*, BRIP1*, CDC73, CDH1*, CDK4, CDKN1B, CDKN2A, CHEK2*, CTNNA1, DICER1, FH, FLCN, KIF1B, LZTR1, MAX, MEN1, MET, MLH1*, MSH2*, MSH3, MSH6*, MUTYH*, NF1*, NF2, NTHL1, PALB2*, PHOX2B, PMS2*, POT1, PRKAR1A, PTCH1, PTEN*, RAD51C*, RAD51D*, RB1, RET, SDHA, SDHAF2, SDHB, SDHC, SDHD, SMAD4, SMARCA4, SMARCB1, SMARCE1, STK11, SUFU, TMEM127, TP53*, TSC1, TSC2, and VHL (sequencing and deletion/duplication); EGFR, EGLN1, HOXB13, KIT, MITF, PDGFRA, POLD1, and POLE (sequencing only); EPCAM and GREM1 (deletion/duplication only). DNA and RNA analyses performed for * genes.    Based on [name]'s personal and family history of cancer, she meets medical criteria for genetic testing. Despite that she meets criteria, she may still have an out of pocket cost. We discussed that if her out of pocket cost for testing is over $100, the laboratory will call and confirm whether she wants to proceed with testing.  If the out of pocket cost of testing is less than $100 she will be billed by the genetic testing laboratory.   We discussed that some people do not want to undergo genetic testing due to fear of genetic discrimination.  The Genetic Information Nondiscrimination Act (GINA) was signed into federal law in 2008. GINA prohibits health insurers and most employers from discriminating against individuals based on genetic information (including the results of genetic tests and family history information). According to GINA, health insurance companies cannot consider genetic information to be a preexisting condition, nor can they use it to make decisions regarding coverage or rates. GINA also makes it illegal for most employers to use genetic information in making decisions about hiring, firing, promotion, or terms of employment. It is important to note that GINA does not offer protections for life insurance, disability insurance, or long-term care insurance. GINA does not apply to those in the [employer], those who work for companies with less than 15 employees, and new life insurance or long-term disability insurance policies.  Health status due to a cancer diagnosis is not protected under GINA. More information about GINA can be found by visiting [email or web address].   PLAN: After considering the risks, benefits, and limitations, [name] provided informed consent to pursue genetic testing and the blood sample was sent to [facility] for analysis of the CancerNext-Expanded+RNAinsight. Results should be available within approximately 2-3 weeks' time, at which point they  will be disclosed by telephone to [name], as will any additional recommendations warranted by these results. [name] will receive a summary of her genetic counseling visit and a copy of her results once available. This information will also be available in Epic.   Based on [name]'s family history, we recommended her son, who was diagnosed with colon cancer at age 93, have genetic counseling and testing. [name] will let us know if we can be of any assistance in coordinating genetic counseling and/or testing for this family member.   Lastly, we encouraged [name] to remain in contact with cancer genetics annually so that we can continuously update the family history and inform her  of any changes in cancer genetics and testing that may be of benefit for this family.   [name] questions were answered to her satisfaction today. Our contact information was provided should additional questions or concerns arise. Thank you for the referral and allowing [place] to share in the care of your patient.    P. Lowell Guitar, MS, [facility] Licensed, Patent attorney [email or web address] phone: [phone]  The patient was seen for a total of 35 minutes in face-to-face genetic counseling.  The patient was seen alone.  Drs. Meliton Rattan, and/or [place] were available for questions, if needed..    _______________________________________________________________________ For Office Staff:  Number of people involved in session: 1 Was an Intern/ student involved with case: no

## 2023-04-21 ENCOUNTER — Telehealth: Payer: Self-pay | Admitting: Genetic Counselor

## 2023-04-21 ENCOUNTER — Encounter: Payer: Self-pay | Admitting: Genetic Counselor

## 2023-04-21 DIAGNOSIS — Z1379 Encounter for other screening for genetic and chromosomal anomalies: Secondary | ICD-10-CM | POA: Insufficient documentation

## 2023-04-21 NOTE — Telephone Encounter (Signed)
LM on VM that results are back and to please call.  Left CB istructions

## 2023-04-29 ENCOUNTER — Telehealth: Payer: Self-pay | Admitting: Genetic Counselor

## 2023-04-29 NOTE — Telephone Encounter (Signed)
LM on VM that results are back and to please call.  Left CB instructions. 

## 2023-05-04 NOTE — Telephone Encounter (Signed)
LM on VM that results are back and to please call.  Left CB instructions. 

## 2023-05-06 NOTE — Telephone Encounter (Signed)
LM on VM that results are back and to please call.  Left CB instructions. 

## 2023-05-07 ENCOUNTER — Ambulatory Visit: Payer: Self-pay | Admitting: Genetic Counselor

## 2023-05-07 DIAGNOSIS — Z1379 Encounter for other screening for genetic and chromosomal anomalies: Secondary | ICD-10-CM

## 2023-05-07 DIAGNOSIS — Z800 Family history of malignant neoplasm of digestive organs: Secondary | ICD-10-CM

## 2023-05-07 NOTE — Progress Notes (Signed)
HPI:  [name] was previously seen in the [facility] due to a family history of colon cancer and concerns regarding a hereditary predisposition to cancer. Please refer to our prior cancer genetics clinic note for more information regarding our discussion, assessment and recommendations, at the time. [name] recent genetic test results were disclosed to her, as were recommendations warranted by these results. These results and recommendations are discussed in more detail below.  CANCER HISTORY:  Oncology History   No history exists.    FAMILY HISTORY:  We obtained a detailed, 4-generation family history.  Significant diagnoses are listed below: Family History  Problem Relation Age of Onset   Heart failure Mother    Heart failure Father    Colon cancer Father 40   Colon cancer Brother 16   Colon cancer Maternal Grandfather    Colon cancer Son 71   Hypertension Other    Breast cancer Cousin        dx. > 50, mat first cousin       The patient has two sons, one who was diagnosed with colon cancer at 65. The patient reports that his genetic testing was negative, but we do not have a copy of these results. She has two brothers, one who was diagnosed with colon cancer at 16.  Both parents are deceased.     The patient's father was diagnosed with colon cancer at 17.  He had three brothers and two sisters who are cancer free. His parents are deceased.   The patient's mother died at 28 from congestive heart failure.  She had seven siblings who are cancer free.  One sister had a daughter with breast cancer.  The patient's maternal grandfather was diagnosed with colon cancer.   [name] is unaware of previous family history of genetic testing for hereditary cancer risks, although she suspects that her son has had genetic testing. Patient's maternal ancestors are of [place] descent, and paternal ancestors are of [place] descent. There is no reported Ashkenazi  Jewish ancestry. There is no known consanguinity.  GENETIC TEST RESULTS: Genetic testing reported out on April 20, 2023 through the CancerNext-Expanded+RNAinsight cancer panel found no pathogenic mutations. The CancerNext-Expanded gene panel offered by [facility] and includes sequencing and rearrangement analysis for the following 77 genes: AIP, ALK, APC*, ATM*, AXIN2, BAP1, BARD1, BMPR1A, BRCA1*, BRCA2*, BRIP1*, CDC73, CDH1*, CDK4, CDKN1B, CDKN2A, CHEK2*, CTNNA1, DICER1, FH, FLCN, KIF1B, LZTR1, MAX, MEN1, MET, MLH1*, MSH2*, MSH3, MSH6*, MUTYH*, NF1*, NF2, NTHL1, PALB2*, PHOX2B, PMS2*, POT1, PRKAR1A, PTCH1, PTEN*, RAD51C*, RAD51D*, RB1, RET, SDHA, SDHAF2, SDHB, SDHC, SDHD, SMAD4, SMARCA4, SMARCB1, SMARCE1, STK11, SUFU, TMEM127, TP53*, TSC1, TSC2, and VHL (sequencing and deletion/duplication); EGFR, EGLN1, HOXB13, KIT, MITF, PDGFRA, POLD1, and POLE (sequencing only); EPCAM and GREM1 (deletion/duplication only). DNA and RNA analyses performed for * genes. The test report has been scanned into EPIC and is located under the Molecular Pathology section of the Results Review tab.  A portion of the result report is included below for reference.     We discussed with [name] that because current genetic testing is not perfect, it is possible there may be a gene mutation in one of these genes that current testing cannot detect, but that chance is small.  We also discussed, that there could be another gene that has not yet been discovered, or that we have not yet tested, that is responsible for the cancer diagnoses in the family. It is also possible there is a  hereditary cause for the cancer in the family that [name] did not inherit and therefore was not identified in her testing.  Therefore, it is important to remain in touch with cancer genetics in the future so that we can continue to offer [name] the most up to date genetic testing.   ADDITIONAL GENETIC TESTING: We discussed with [name]  that her genetic testing was fairly extensive.  If there are genes identified to increase cancer risk that can be analyzed in the future, we would be happy to discuss and coordinate this testing at that time.    CANCER SCREENING RECOMMENDATIONS: [name] test result is considered negative (normal).  This means that we have not identified a hereditary cause for her family history of colon cancer at this time. Most cancers happen by chance and this negative test suggests that her family history of cancer may fall into this category.    Possible reasons for [name]'s negative genetic test include:  1. There may be a gene mutation in one of these genes that current testing methods cannot detect but that chance is small.  2. There could be another gene that has not yet been discovered, or that we have not yet tested, that is responsible for the cancer diagnoses in the family.  3.  There may be no hereditary risk for cancer in the family. The cancers in [name] and/or her family may be sporadic/familial or due to other genetic and environmental factors. 4. It is also possible there is a hereditary cause for the cancer in the family that [name] did not inherit.  Therefore, it is recommended she continue to follow the cancer management and screening guidelines provided by her primary healthcare provider. An individual's cancer risk and medical management are not determined by genetic test results alone. Overall cancer risk assessment incorporates additional factors, including personal medical history, family history, and any available genetic information that may result in a personalized plan for cancer prevention and surveillance  RECOMMENDATIONS FOR FAMILY MEMBERS:  Individuals in this family might be at some increased risk of developing cancer, over the general population risk, simply due to the family history of cancer.  We recommended women in this family have a yearly mammogram  beginning at age 58, or 55 years younger than the earliest onset of cancer, an annual clinical breast exam, and perform monthly breast self-exams. Women in this family should also have a gynecological exam as recommended by their primary provider. All family members should be referred for colonoscopy starting at age 8.  FOLLOW-UP: Lastly, we discussed with [name] that cancer genetics is a rapidly advancing field and it is possible that new genetic tests will be appropriate for her and/or her family members in the future. We encouraged her to remain in contact with cancer genetics on an annual basis so we can update her personal and family histories and let her know of advances in cancer genetics that may benefit this family.   Our contact number was provided. [name] questions were answered to her satisfaction, and she knows she is welcome to call us at anytime with additional questions or concerns.   Maylon Cos, MS, [facility] Licensed, Certified Genetic Counselor [email or web address]

## 2023-05-07 NOTE — Telephone Encounter (Signed)
4th phone call with no answer.  LM on VM that results were normal and that I was going to write a clinic note and upload a copy of results to her patient portal.

## 2023-05-26 ENCOUNTER — Other Ambulatory Visit (HOSPITAL_COMMUNITY): Payer: Self-pay | Admitting: Family Medicine

## 2023-05-26 ENCOUNTER — Ambulatory Visit (HOSPITAL_COMMUNITY)
Admission: RE | Admit: 2023-05-26 | Discharge: 2023-05-26 | Disposition: A | Discharge status: Home / Self Care | Payer: No Typology Code available for payment source | Source: Ambulatory Visit | Attending: Family Medicine | Admitting: Family Medicine

## 2023-05-26 DIAGNOSIS — R051 Acute cough: Secondary | ICD-10-CM

## 2023-06-02 ENCOUNTER — Other Ambulatory Visit: Payer: Self-pay | Admitting: Adult Health

## 2023-06-17 ENCOUNTER — Other Ambulatory Visit (HOSPITAL_COMMUNITY): Payer: Self-pay | Admitting: Family Medicine

## 2023-06-17 DIAGNOSIS — E041 Nontoxic single thyroid nodule: Secondary | ICD-10-CM

## 2023-06-25 ENCOUNTER — Ambulatory Visit (HOSPITAL_COMMUNITY)
Admission: RE | Admit: 2023-06-25 | Discharge: 2023-06-25 | Disposition: A | Discharge status: Home / Self Care | Payer: No Typology Code available for payment source | Source: Ambulatory Visit | Attending: Family Medicine | Admitting: Family Medicine

## 2023-06-25 DIAGNOSIS — E041 Nontoxic single thyroid nodule: Secondary | ICD-10-CM | POA: Insufficient documentation

## 2023-07-14 ENCOUNTER — Ambulatory Visit: Payer: No Typology Code available for payment source | Admitting: Adult Health

## 2023-07-14 ENCOUNTER — Encounter: Payer: Self-pay | Admitting: Adult Health

## 2023-07-14 VITALS — BP 130/75 | HR 56 | Ht 71.0 in | Wt 232.0 lb

## 2023-07-14 DIAGNOSIS — E538 Deficiency of other specified B group vitamins: Secondary | ICD-10-CM

## 2023-07-14 DIAGNOSIS — G43E09 Chronic migraine with aura, not intractable, without status migrainosus: Secondary | ICD-10-CM

## 2023-07-14 DIAGNOSIS — E559 Vitamin D deficiency, unspecified: Secondary | ICD-10-CM

## 2023-07-14 DIAGNOSIS — R42 Dizziness and giddiness: Secondary | ICD-10-CM | POA: Diagnosis not present

## 2023-07-14 DIAGNOSIS — I6381 Other cerebral infarction due to occlusion or stenosis of small artery: Secondary | ICD-10-CM

## 2023-07-14 DIAGNOSIS — R432 Parageusia: Secondary | ICD-10-CM

## 2023-07-14 DIAGNOSIS — R61 Generalized hyperhidrosis: Secondary | ICD-10-CM

## 2023-07-14 NOTE — Patient Instructions (Addendum)
You will be called to complete a CTA head/neck to further evaluate causes of dizziness  We will recheck lab work today   Continue propronolol LA 60mg  daily  Continue Nurtec and baclofen as needed for migraine rescue  Continue Plavix and Crestor for secondary stroke prevention  Continue to follow with PCP for stroke risk factor management       Followup in the future with me in 6 months or call earlier if needed       Thank you for coming to see [place] at [facility]. I hope we have been able to provide you high quality care today.  You may receive a patient satisfaction survey over the next few weeks. We would appreciate your feedback and comments so that we may continue to improve ourselves and the health of our patients.

## 2023-07-14 NOTE — Progress Notes (Signed)
[facility] [address] [place]. [place] [address] [phone]       STROKE FOLLOW UP NOTE  Ms. [name] Date of Birth:  11/08/1960 Medical Record Number:  [id number]   Reason for visit: Stroke and migraine f/u    CHIEF COMPLAINT:  Chief Complaint  Patient presents with   Follow-up    Patient in room #8 and alone. Patient states her stress level is high and she having some dizziness.      HPI:   Update 07/14/2023 JM: Patient returns for follow-up visit prior visit with Amy, NP.  At prior visit, reported overall stable until recently with increased migraine headaches over the past few months. Seen in ED 11/2022 with dizziness and left hand clumsiness, MRI brain negative, dx'd with complicated migraine.  Added Ajovy monthly injections in addition to propranolol LA 60 mg daily and continuation of baclofen and Nurtec as needed.  Stable from stroke standpoint.  Today, she reports improvement of migraine headaches, occurring rarley, will use Nurtec and baclofen with benefit. She never started Ajovy as she didn't think insurance approved (although they approved back in May). She continues on propranolol LA 60mg  daily.  Prozac dosage increased around the time of prior visit which helped reduce stress levels which she believes helped improve migraines.  She does have occasional tension type headaches due to high stress levels at work but these are not debilitating.  Continues to have intermittent dizziness, feels lightheaded sensation and can veer towards the left.  Not necessarily worsened with quick head movement or position changes.  She is being followed by ophthalmology for vision, does have some difficulty with central vision. She also mentions feeling very warm and diaphoretic easily and can take a while to cool back down. She is unsure if this is a neurological issue, she has not further discussed with other providers.  Continues to follow with mood treatment  center for management of mood currently on fluoxetine 60 mg daily and Ativan as needed, feels mood overall stable. Some mild short-term memory difficulties but overall stable. Denies new stroke/TIA symptoms.  Compliant on Plavix and Crestor.  Routinely follows with PCP for stroke risk factor management.     History provided for reference purposes only Update 01/11/23 ALL: TERENA BLUEFORD is a 62 y.o. female here today for follow up for history of CVA and migraines. She continues propranolol ER 60mg  daily and Nurtec as needed.She was doing fairly well until recently. She reports stress at work has significantly increased. She was seen in the ER 12/03/2022 for a complicated migraine. She had dizziness and left hand clumsiness. ER eval unremarkable. She reports Nurtec helps with some migraines and not with others. Baclofen usually helps. She usually takes gabapentin 600mg  BID, Prozac 40mg  daily and Ativan as needed for mood management.    She continues rosuvastatin and Plavix daily. She is followed regularly by PCP. BP is usually well managed.   Update 12/31/2021 JM: Patient returns for 63-month stroke and migraine follow-up.  Overall stable without new stroke/TIA symptoms.  Compliant on stroke prevention medications.  Blood pressure today 122/84.  Closely follows with PCP and cardiology. Reports about 2 severe migraines over the past 6 months- severe migraines can last up to 3 days and debilitating.  Has been having tension type headaches due to increased stressors (son dx'd with stage 3 colon cancer back in December, undergoing chemo currently, lives in [place] with child on the way due end of this month).  Remains  on propranolol, denies side effects.  Use of Nurtec with benefit but baclofen seems to be more beneficial.  Reports nightly use of CPAP for apnea management. Recently cleared by cardiology to pursue left knee replacement - not yet scheduled.  No further concerns at this time.  Update 07/03/2021 JM:  Returns for 17-month stroke and migraine follow-up unaccompanied  Stable from stroke standpoint -no new stroke/TIA symptoms Compliant on Plavix, Nexletol and Crestor -denies side effects Blood pressure today 143/72 Reports compliance on CPAP  Migraines have greatly improved since starting propranolol after prior visit has had 2 migraines since prior visit but not as severe Bernita Raisin - will stop migraine but will return within 1-2 hrs  - has not tried to retake in 2 hours She did try baclofen for headache which greatly helped - does not have active prescription   Continues to follow with psychiatry/psychology routinely   No further concerns at this time  Update 01/02/2021 JM: Ms. Hruska returns for 51-month stroke and migraine follow-up unaccompanied  Has been stable from stroke standpoint without new stroke/TIA symptoms Reports compliance on Plavix, Nexletol and Crestor without associated side effects Blood pressure today 149/83 -occasionally monitors at home which has been stable Remains compliant on CPAP for OSA management  Migraines have been relatively stable but she does note worsening around weather changes especially during the colder months as well as with increased stressors. She did try Nurtec but no benefit. She will use occasional advil and tylenol with some benefit. She continues to follow with p[facility]ch at [facility] for anxiety which continues to be beneficial. Remains on gabapentin 300mg  twice daily with some benefit of migraines and chronic cervical pain.    No further concerns at this time  Update 07/04/2020 JM: Ms. Trichel returns for routine follow-up.  Stable since prior visit without new or worsening stroke/TIA symptoms.  She reports resolution of worsening left arm paresthesias since prior visit.  Underwent MRI which did not show new abnormality and EMG/NCV unremarkable.  She does have chronic cervical pain which has been relatively stable.  She will do dry  needling as needed with great benefit.  Reports chronic migraines relatively stable experiencing approximately 3-4 migraine days per month.  Typically either left or right temporal pulsating pain associated with photophobia and phonophobia but denies recent nausea/vomiting.  She continues to follow with mood treatment center in [place], [place] for generalized anxiety with great benefit as well as improvement of migraines.  Recently they increased dose of gabapentin currently on 600 mg twice daily with improvement of cervical neck pain and migraines.  Also remains on fluoxetine and lorazepam as needed for increased anxiety or migraine abortive. Remains on Plavix without bleeding or bruising.  Remains on Nexletol as well as Crestor which was initiated after prior visit for elevated LDL which she has remained on tolerating well without side effects.  Blood pressure today 144/77.  Monitors at home which has been stable.  Reports ongoing compliance with CPAP for OSA management.  No further concerns at this time.  Update 03/17/2020 JM: Ms. Bartlett is being seen per patient request due to progressively worsening tingling of LUE over the past 5 weeks.  Previously seen for possible right thalamic stroke vs migraine with residual left hand middle fingertip numbness and over the past 5 weeks, now experiencing transient tingling whole hand and forearm.  Symptoms are transient with a vibratory/tingling sensation and can last for a couple of minutes and then subside and typically do not past  the elbow.  Denies weakness, numbness, nerve pain, lower extremity symptoms or any other associated symptoms. Denies wrist, elbow, neck or shoulder pain. History of headaches/migraines which have been stable and his only experience 1 migraine over the past 4 months.  Reports increased stressors due to work issues and recent passing of her father which preceded the onset of symptoms.  Continues to follow with psychology/psychiatry with  great benefit and ongoing use of fluoxetine and gabapentin but does admit to increased anxiety related to stressors. Remains on all prescribed medications including clopidogrel and Nexletol without side effects.  Has not had recent lab work.  Blood pressure today 149/82.  Endorses compliance of CPAP for sleep apnea management.  Denies recent medication changes.  Returns today for evaluation.  Update 09/27/2019: Ms. Attard is a 61 year old female who is being seen today for follow-up.  Residual stroke deficits include very mild left hand middle fingertip numbness but overall improving.  She has since established care with mood treatment center in [place] with great improvement of her overall anxiety and stress.  She believes this is greatly impacted her headache/migraine frequency as she believes she had an actual migraine back in November but has not had any since that time.  She used Aimovig for the month of November but did not continue as she did not have any additional refills.  She was also found to have sleep apnea and has been using her CPAP machine for the past 3 months.  She continues on Plavix without bleeding or bruising.  Atorvastatin discontinued due to myalgias and placed on Nexletol by PCP.  Blood pressure today 142/88.  Denies new or worsening stroke/TIA symptoms.  Initial visit 06/26/2019 JM: Ms. Zegarelli is being seen today for hospital follow-up.  Residual stroke deficits mild left finger numbness which can worsen with temperature change but overall improving.  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  She does report high stress with her job position and increased stress when her headache started approximately 4 months ago.  History of migraines with occasional visual auras but has not experienced migraine headache in numerous years.  She was referred to headache wellness clinic and saw Dr. Neale Burly where she received trigger point injections and trialed Robaxin,  baclofen and Fioricet without much benefit.  She had previously been on Topamax and beta-blockers for preventative migraine treatment but difficulty tolerating.  She has trialed abortive therapies in the past but had complications.  She is in the process of initiating Aimovig with Dr. Neale Burly but has not yet started.  Current headaches typically start with bilateral neck tension and then experience pain left temporal and frontal region.  These headaches will be associated with photophobia and phonophobia.  She is planning on undergoing HST tomorrow which was ordered by her PCP to assess for potential sleep apnea.  She has completed 3 weeks DAPT and continues on Plavix alone without bleeding or bruising.  Continues on atorvastatin without myalgias.  Does plan on having follow-up with PCP after HST completed and plans on doing lab work at that time.  Blood pressure today satisfactory 122/69.  No further concerns at this time.  Stroke admission 05/26/2019: Ms. SYRITA RADELL is a 62 y.o. female with history of Htn and migraines  presented with multiple transient episodes of perioral numbness, left-sided face arm and leg numbness that lasted for a few minutes followed by headache.   Stroke work-up completed with presenting symptoms possibly secondary to migraines versus  stroke with questionable tiny acute infarction along the lateral right thalamus as evidenced on MRI secondary to small vessel disease.  Carotid Dopplers unremarkable.  2D echo normal EF without cardiac source of embolus identified.  LDL 175.  A1c 5.7.  Recommended DAPT for 3 weeks and Plavix alone as previously on aspirin.  HTN stable.  Initiated atorvastatin 80 mg daily for HLD management.  Other stroke risk factors include advanced age, former tobacco use, EtOH use, obesity and history of migraines.  Discharged home in stable condition without therapy needs.    ROS:   14 system review of systems performed and negative with exception of those  listed in HPI  PMH:  Past Medical History:  Diagnosis Date   Anxiety    Arthritis    Dysrhythmia    Family history of breast cancer    Family history of colon cancer    History of kidney stones    Hypertension    Migraines    Pneumonia    Sleep apnea    Stroke (HCC)    Tuberculosis    test positive, never had    PSH:  Past Surgical History:  Procedure Laterality Date   ABDOMINAL HYSTERECTOMY     COLONOSCOPY WITH PROPOFOL N/A 01/20/2023   Procedure: COLONOSCOPY WITH PROPOFOL;  Surgeon: Corbin Ade, MD;  Location: [facility];  Service: Endoscopy;  Laterality: N/A;  9:00 am   CYST EXCISION     left knee   KNEE ARTHROSCOPY Left    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   Procedure: TOTAL KNEE ARTHROPLASTY;  Surgeon: Joen Laura, MD;  Location: [facility] OR[facility];  Service: Orthopedics;  Laterality: Left;   WISDOM TOOTH EXTRACTION      Social History:  Social History   Socioeconomic History   Marital status: Divorced    Spouse name: Not on file   Number of children: Not on file   Years of education: Not on file   Highest education level: Not on file  Occupational History   Not on file  Tobacco Use   Smoking status: Former    Current packs/day: 0.00    Average packs/day: 0.3 packs/day for 5.0 years (1.3 ttl pk-yrs)    Types: Cigarettes    Start date: 01/02/2005    Quit date: 01/02/2010    Years since quitting: 13.5   Smokeless tobacco: Never  Vaping Use   Vaping status: Never Used  Substance and Sexual Activity   Alcohol use: Not Currently   Drug use: No   Sexual activity: Not on file  Other Topics Concern   Not on file  Social History Narrative   Not on file   Social Determinants of Health   Financial Resource Strain: Not on file  Food Insecurity: Not on file  Transportation Needs: Not on file  Physical Activity: Not on file  Stress: Not on file  Social Connections: Unknown (06/14/2023)   Received from [facility]   Social Network    Social  Network: Not on file  Intimate Partner Violence: Unknown (06/14/2023)   Received from [facility]   HITS    Physically Hurt: Not on file    Insult or Talk Down To: Not on file    Threaten Physical Harm: Not on file    Scream or Curse: Not on file    Family History:  Family History  Problem Relation Age of Onset   Heart failure Mother    Heart failure Father    Colon cancer  Father 7   Colon cancer Brother 14   Colon cancer Maternal Grandfather    Colon cancer Son 57   Hypertension Other    Breast cancer Cousin        dx. > 50, mat first cousin    Medications:   Current Outpatient Medications on File Prior to Visit  Medication Sig Dispense Refill   aspirin EC 81 MG tablet Take by mouth.     baclofen (LIORESAL) 10 MG tablet Take 1 tablet (10 mg total) by mouth 2 (two) times daily as needed (migraine headaches). 180 each 1   Bempedoic Acid (NEXLETOL) 180 MG TABS Take 180 mg by mouth at bedtime.     CALCIUM PO Take 1 tablet by mouth daily.     Cholecalciferol (VITAMIN D3 PO) Take 1 tablet by mouth daily.     clopidogrel (PLAVIX) 75 MG tablet Take 1 tablet (75 mg total) by mouth daily.     Coenzyme Q10 (COQ10) 200 MG CAPS Take 200 mg by mouth daily.      FLUoxetine (PROZAC) 40 MG capsule Take 40 mg by mouth daily.     gabapentin (NEURONTIN) 300 MG capsule Take 600 mg by mouth 2 (two) times daily.     hydrochlorothiazide (MICROZIDE) 12.5 MG capsule Take 12.5 mg by mouth daily.     LORazepam (ATIVAN) 0.5 MG tablet Take 0.5 mg by mouth as needed. Dose unknown     losartan (COZAAR) 50 MG tablet Take 50 mg by mouth daily.     MAGNESIUM PO Take 1 tablet by mouth daily.     Omega-3 Fatty Acids (FISH OIL PO) Take 1 capsule by mouth 2 (two) times daily.     propranolol ER (INDERAL LA) 60 MG 24 hr capsule Take 1 capsule (60 mg total) by mouth daily. 90 capsule 3   Rimegepant Sulfate (NURTEC) 75 MG TBDP Take 1 tablet (75 mg total) by mouth daily as needed (For migraine abortive therapy).  8 tablet 11   rosuvastatin (CRESTOR) 20 MG tablet TAKE ONE TABLET (20MG  TOTAL) BY MOUTH DAILY 90 tablet 3   No current facility-administered medications on file prior to visit.    Allergies:   Allergies  Allergen Reactions   Chocolate Other (See Comments)    Consuming a little more than usual can trigger a migraine   Citrus Other (See Comments)    Consuming a little more than usual can trigger a migraine   Codeine Other (See Comments)    Headache   Lipitor [Atorvastatin] Other (See Comments)    Joint pain   Nitrates, Organic Other (See Comments)    Consuming a little more than usual can trigger a migraine   Peanut-Containing Drug Products Other (See Comments)    Consuming a little more than usual can trigger a migraine   Pineapple Other (See Comments)    Consuming a little more than usual can trigger a migraine     Physical Exam  Vitals:   07/14/23 0757  BP: 130/75  Pulse: (!) 56  Weight: 232 lb (105.2 kg)  Height: 5\' 11"  (1.803 m)   Body mass index is 32.36 kg/m. No results found.  General: well developed, well nourished, very pleasant middle-age Caucasian female, seated, in no evident distress  Neurologic Exam Mental Status: Awake and fully alert.   Fluent speech and language. Oriented to place and time. Recent memory mildly impaired and remote memory intact. Attention span, concentration and fund of knowledge appropriate. Mood and affect appropriate.  Cranial  Nerves: Pupils equal, briskly reactive to light. Extraocular movements full without nystagmus. Visual fields full to confrontation. Hearing intact. Facial sensation intact. Face, tongue, palate moves normally and symmetrically.  Motor: Normal bulk and tone. Normal strength in all tested extremity muscles. Sensory.:  Intact to light touch, vibratory and pinprick sensation Coordination: Rapid alternating movements normal in all extremities. Finger-to-nose and heel-to-shin performed accurately bilaterally. Gait and  Station: Arises from chair without difficulty. Stance is normal. Gait demonstrates normal stride length and balance without use of assistive device Reflexes: 1+ and symmetric; Toes downgoing.        ASSESSMENT/PLAN: SUSSIE DIOGO is a 62 y.o. year old female is being seen today per patient request due to 5-week onset of transient LUE tingling with hypersensation and ataxia as well as slightly decreased reflex.  History of multiple transient episodes of left-sided numbness followed by a headache on 05/26/19 potentially related to complicated migraines vs possible small lateral right thalamus stroke secondary to small vessel disease. Vascular risk factors include HTN, HLD, migraines and OSA on CPAP.      Hx of right thalamic stroke:  Continue aspirin 81 mg daily  and Crestor and Nexletol for secondary stroke prevention and per cardiology recommendations Discussed secondary stroke prevention and importance of close PCP follow-up for aggressive stroke risk factor management including BP goal<130/90, HLD with LDL goal<70 and pre-DM with A1c.<7   Migraines with aura, chronic:  Migraines since improved continue propranolol ER 60 mg daily - refill provided Continue baclofen 10mg  BID PRN and Nurtec 75mg  PRN for abortive therapy.  No benefit with Bernita Raisin, not triptan candidate d/t stroke hx Continue gabapentin 300 mg twice daily managed by psychiatry  Dizziness unsteadiness Present since beginning of 2024 MRI brain 11/2022 unremarkable Recommend completion of CTA head/neck, if unremarkable, may consider C-spine evaluation Will check lab work today to look for reversible causes  Excessive sweating Unknown etiology, low suspicion neurological etiology as no other associated symptoms Possibly medication side effect vs hormonal changes Will check TSH today Encouraged further discussion with PCP and/or OB/GYN      Follow-up in 6 months or call earlier if needed    CC:  Elfredia Nevins,  MD    I spent a prolonged 50 minutes of face-to-face and non-face-to-face time with patient.  This included previsit chart review, lab review, study review, order entry, electronic health record documentation, patient education and discussion regarding above diagnoses and treatment plan and answered all other questions to patient's satisfaction  Ihor Austin, [facility]  [facility] [address] [place], [place] [address]  Phone [phone] Fax [phone] Note: This document was prepared with digital dictation and possible smart phrase technology. Any transcriptional errors that result from this process are unintentional.

## 2023-07-15 LAB — BASIC METABOLIC PANEL
BUN/Creatinine Ratio: 15 (ref 12–28)
BUN: 22 mg/dL (ref 8–27)
CO2: 26 mmol/L (ref 20–29)
Calcium: 9.7 mg/dL (ref 8.7–10.3)
Chloride: 100 mmol/L (ref 96–106)
Creatinine, Ser: 1.45 mg/dL — ABNORMAL HIGH (ref 0.57–1.00)
Glucose: 107 mg/dL — ABNORMAL HIGH (ref 70–99)
Potassium: 3.8 mmol/L (ref 3.5–5.2)
Sodium: 142 mmol/L (ref 134–144)
eGFR: 40 mL/min/{1.73_m2} — ABNORMAL LOW (ref >59)

## 2023-07-15 LAB — TSH: TSH: 1.52 u[IU]/mL (ref 0.450–4.500)

## 2023-07-15 LAB — VITAMIN B12: Vitamin B-12: 783 pg/mL (ref 232–1245)

## 2023-07-15 LAB — MAGNESIUM: Magnesium: 2.2 mg/dL (ref 1.6–2.3)

## 2023-07-15 LAB — VITAMIN D 25 HYDROXY (VIT D DEFICIENCY, FRACTURES): Vit D, 25-Hydroxy: 51.1 ng/mL (ref 30.0–100.0)

## 2023-07-19 ENCOUNTER — Telehealth: Payer: Self-pay | Admitting: Adult Health

## 2023-07-19 NOTE — Telephone Encounter (Signed)
sent to GI they obtain [employer] auth [id number] 

## 2023-07-28 ENCOUNTER — Ambulatory Visit
Admission: RE | Admit: 2023-07-28 | Discharge: 2023-07-28 | Disposition: A | Discharge status: Home / Self Care | Payer: No Typology Code available for payment source | Source: Ambulatory Visit | Attending: Adult Health | Admitting: Adult Health

## 2023-07-28 ENCOUNTER — Other Ambulatory Visit: Payer: Self-pay | Admitting: Adult Health

## 2023-07-28 DIAGNOSIS — R42 Dizziness and giddiness: Secondary | ICD-10-CM

## 2023-07-28 DIAGNOSIS — R61 Generalized hyperhidrosis: Secondary | ICD-10-CM

## 2023-07-28 DIAGNOSIS — E559 Vitamin D deficiency, unspecified: Secondary | ICD-10-CM

## 2023-07-28 DIAGNOSIS — G43E09 Chronic migraine with aura, not intractable, without status migrainosus: Secondary | ICD-10-CM

## 2023-07-28 DIAGNOSIS — R432 Parageusia: Secondary | ICD-10-CM

## 2023-07-28 DIAGNOSIS — I6381 Other cerebral infarction due to occlusion or stenosis of small artery: Secondary | ICD-10-CM

## 2023-07-28 DIAGNOSIS — E538 Deficiency of other specified B group vitamins: Secondary | ICD-10-CM

## 2023-07-28 MED ORDER — IOPAMIDOL (ISOVUE-370) INJECTION 76%
65.0000 mL | Freq: Once | INTRAVENOUS | Status: AC | PRN
  Administered 2023-07-28: 65 mL via INTRAVENOUS

## 2023-12-28 ENCOUNTER — Telehealth: Payer: Self-pay | Admitting: Adult Health

## 2023-12-28 NOTE — Telephone Encounter (Signed)
 Pt called stating that she had a fall yesterday and she is wondering if it was a mini stroke or what could it be. Pt is asking, Should she be seen here or should she call her PCP ? She states that Thursday she will be leaving out of town.

## 2023-12-28 NOTE — Telephone Encounter (Signed)
 Spoke w/Pt regarding the fall and dizziness she is experiencing at times since increasing her allergy medication. Pt states is taking OTC Zyrtec in the AM and Mucus DM at night and has had some shaking and dizziness and had a fall yesterday. Informed Pt she needs to speak w/PCP regarding allergy medication and dosages as both can cause dizziness and Zyrtec can cause jitteriness. Pt stated understanding and that she plans to go to PCP for walk-in visit tomorrow AM. Pt also voice thankfulness for the call back.

## 2023-12-29 ENCOUNTER — Other Ambulatory Visit (HOSPITAL_COMMUNITY): Payer: Self-pay | Admitting: Internal Medicine

## 2023-12-29 ENCOUNTER — Ambulatory Visit (HOSPITAL_COMMUNITY)
Admission: RE | Admit: 2023-12-29 | Discharge: 2023-12-29 | Disposition: A | Discharge status: Home / Self Care | Source: Ambulatory Visit | Attending: Internal Medicine | Admitting: Internal Medicine

## 2023-12-29 DIAGNOSIS — R42 Dizziness and giddiness: Secondary | ICD-10-CM | POA: Insufficient documentation

## 2023-12-29 DIAGNOSIS — S0990XA Unspecified injury of head, initial encounter: Secondary | ICD-10-CM | POA: Insufficient documentation

## 2024-01-18 NOTE — Progress Notes (Signed)
 [facility] [address] [place]. [place] [address] [phone]       STROKE FOLLOW UP NOTE  Ms. Cherylynn Cosier Date of Birth:  1960/12/09 Medical Record Number:  [id number]   Reason for visit: Stroke and migraine f/u    CHIEF COMPLAINT:  Chief Complaint  Patient presents with   Migraine    Rm 8 alone Pt is well, reports no new stroke concerns but she did call in with dizzy spells in April.  She mentions she is having about 2 headache days a week.      HPI:   Update 01/19/2024 JM: Patient returns for follow-up visit.  At prior visit, reported continued intermittent dizziness, proceeded with CTA head/neck 07/2023 (as prior MRI brain no acute abnormalities) which was largely benign with only minimal extracranial and intracranial arthrosclerosis.   Reported episode in April where she felt jittery in hands bilaterally and difficulty with fine motor control, she stood up from a bent over position and experienced dizziness, stumbled backwards and hit her head twice on counter.  Was seen by PCP who completed MRI brain without acute abnormalities.  It was questioned if symptoms are due to to increasing allergy medication with taking Zyrtec nightly and Mucinex DM in the morning. Reported similar episode last April with full ED workup unremarkable and felt possibly related to complicated migraine. She also has chronic right knee pain and questions if leg gave out causing fall.   She still has occasional episodes of dizziness.  Sometimes can be associated with position changes but other times not.  She does endorse sinus issues and questions if this is contributing.  She has not previously been seen by ENT.  She does endorse drinking plenty of water  during the day.  She has been experiencing some tension headaches but no recent migrainous headaches. Will use baclofen  as needed with resolution.  She was using Advil quite frequently but has since stopped this.  Remains on  propranolol  LA 60 mg nightly. Has not recently needed Nurtec.   She continues to have mild short-term memory difficulties but denies worsening  Does endorse continued significant work stressors.  She plans on transitioning to working from home twice weekly and then 3 days still in office. She is eligible to retire in July but is not sure if she will pursue this year or not.  She continues to work psychology and psychiatry which she continues to feel is beneficial.  She also mentions recent lab work by PCP which showed elevated creatinine at 2.06, an appointment with nephrologist in July.   No new stroke/TIA symptoms.  Reports compliance on Plavix  and Crestor .  Routinely follows with PCP for stroke risk factor management.     History provided for reference purposes only Update 07/14/2023 JM: Patient returns for follow-up visit prior visit with Amy, NP.  At prior visit, reported overall stable until recently with increased migraine headaches over the past few months. Seen in ED 11/2022 with dizziness and left hand clumsiness, MRI brain negative, dx'd with complicated migraine.  Added Ajovy  monthly injections in addition to propranolol  LA 60 mg daily and continuation of baclofen  and Nurtec as needed.  Stable from stroke standpoint.  Today, she reports improvement of migraine headaches, occurring rarley, will use Nurtec and baclofen  with benefit. She never started Ajovy  as she didn't think insurance approved (although they approved back in May). She continues on propranolol  LA 60mg  daily.  Prozac dosage increased around the time of prior visit which helped  reduce stress levels which she believes helped improve migraines.  She does have occasional tension type headaches due to high stress levels at work but these are not debilitating.  Continues to have intermittent dizziness, feels lightheaded sensation and can veer towards the left.  Not necessarily worsened with quick head movement or position changes.   She is being followed by ophthalmology for vision, does have some difficulty with central vision. She also mentions feeling very warm and diaphoretic easily and can take a while to cool back down. She is unsure if this is a neurological issue, she has not further discussed with other providers.  Continues to follow with mood treatment center for management of mood currently on fluoxetine 60 mg daily and Ativan as needed, feels mood overall stable. Some mild short-term memory difficulties but overall stable. Denies new stroke/TIA symptoms.  Compliant on Plavix  and Crestor .  Routinely follows with PCP for stroke risk factor management.  Update 01/11/23 ALL: ESMIRNA RAVAN is a 63 y.o. female here today for follow up for history of CVA and migraines. She continues propranolol  ER 60mg  daily and Nurtec as needed.She was doing fairly well until recently. She reports stress at work has significantly increased. She was seen in the ER 12/03/2022 for a complicated migraine. She had dizziness and left hand clumsiness. ER eval unremarkable. She reports Nurtec helps with some migraines and not with others. Baclofen  usually helps. She usually takes gabapentin 600mg  BID, Prozac 40mg  daily and Ativan as needed for mood management.    She continues rosuvastatin  and Plavix  daily. She is followed regularly by PCP. BP is usually well managed.   Update 12/31/2021 JM: Patient returns for 87-month stroke and migraine follow-up.  Overall stable without new stroke/TIA symptoms.  Compliant on stroke prevention medications.  Blood pressure today 122/84.  Closely follows with PCP and cardiology. Reports about 2 severe migraines over the past 6 months- severe migraines can last up to 3 days and debilitating.  Has been having tension type headaches due to increased stressors (son dx'd with stage 3 colon cancer back in December, undergoing chemo currently, lives in [place]  with child on the way due end of this month).  Remains on propranolol ,  denies side effects.  Use of Nurtec with benefit but baclofen  seems to be more beneficial.  Reports nightly use of CPAP for apnea management. Recently cleared by cardiology to pursue left knee replacement - not yet scheduled.  No further concerns at this time.  Update 07/03/2021 JM: Returns for 69-month stroke and migraine follow-up unaccompanied  Stable from stroke standpoint -no new stroke/TIA symptoms Compliant on Plavix , Nexletol and Crestor  -denies side effects Blood pressure today 143/72 Reports compliance on CPAP  Migraines have greatly improved since starting propranolol  after prior visit has had 2 migraines since prior visit but not as severe Florette Hurry - will stop migraine but will return within 1-2 hrs  - has not tried to retake in 2 hours She did try baclofen  for headache which greatly helped - does not have active prescription   Continues to follow with psychiatry/psychology routinely   No further concerns at this time  Update 01/02/2021 JM: Ms. Andreen returns for 18-month stroke and migraine follow-up unaccompanied  Has been stable from stroke standpoint without new stroke/TIA symptoms Reports compliance on Plavix , Nexletol and Crestor  without associated side effects Blood pressure today 149/83 -occasionally monitors at home which has been stable Remains compliant on CPAP for OSA management  Migraines have been relatively stable but she does  note worsening around weather changes especially during the colder months as well as with increased stressors. She did try Nurtec but no benefit. She will use occasional advil and tylenol  with some benefit. She continues to follow with pysch at [facility] for anxiety which continues to be beneficial. Remains on gabapentin 300mg  twice daily with some benefit of migraines and chronic cervical pain.    No further concerns at this time  Update 07/04/2020 JM: Ms. Walls returns for routine follow-up.  Stable since prior visit without  new or worsening stroke/TIA symptoms.  She reports resolution of worsening left arm paresthesias since prior visit.  Underwent MRI which did not show new abnormality and EMG/NCV unremarkable.  She does have chronic cervical pain which has been relatively stable.  She will do dry needling as needed with great benefit.  Reports chronic migraines relatively stable experiencing approximately 3-4 migraine days per month.  Typically either left or right temporal pulsating pain associated with photophobia and phonophobia but denies recent nausea/vomiting.  She continues to follow with mood treatment center in [place], [place] for generalized anxiety with great benefit as well as improvement of migraines.  Recently they increased dose of gabapentin currently on 600 mg twice daily with improvement of cervical neck pain and migraines.  Also remains on fluoxetine and lorazepam as needed for increased anxiety or migraine abortive. Remains on Plavix  without bleeding or bruising.  Remains on Nexletol as well as Crestor  which was initiated after prior visit for elevated LDL which she has remained on tolerating well without side effects.  Blood pressure today 144/77.  Monitors at home which has been stable.  Reports ongoing compliance with CPAP for OSA management.  No further concerns at this time.  Update 03/17/2020 JM: Ms. Tellefsen is being seen per patient request due to progressively worsening tingling of LUE over the past 5 weeks.  Previously seen for possible right thalamic stroke vs migraine with residual left hand middle fingertip numbness and over the past 5 weeks, now experiencing transient tingling whole hand and forearm.  Symptoms are transient with a vibratory/tingling sensation and can last for a couple of minutes and then subside and typically do not past the elbow.  Denies weakness, numbness, nerve pain, lower extremity symptoms or any other associated symptoms. Denies wrist, elbow, neck or shoulder pain. History  of headaches/migraines which have been stable and his only experience 1 migraine over the past 4 months.  Reports increased stressors due to work issues and recent passing of her father which preceded the onset of symptoms.  Continues to follow with psychology/psychiatry with great benefit and ongoing use of fluoxetine and gabapentin but does admit to increased anxiety related to stressors. Remains on all prescribed medications including clopidogrel  and Nexletol without side effects.  Has not had recent lab work.  Blood pressure today 149/82.  Endorses compliance of CPAP for sleep apnea management.  Denies recent medication changes.  Returns today for evaluation.  Update 09/27/2019: Ms. Rayl is a 63 year old female who is being seen today for follow-up.  Residual stroke deficits include very mild left hand middle fingertip numbness but overall improving.  She has since established care with mood treatment center in [place] with great improvement of her overall anxiety and stress.  She believes this is greatly impacted her headache/migraine frequency as she believes she had an actual migraine back in November but has not had any since that time.  She used Aimovig for the month of November but did not continue  as she did not have any additional refills.  She was also found to have sleep apnea and has been using her CPAP machine for the past 3 months.  She continues on Plavix  without bleeding or bruising.  Atorvastatin  discontinued due to myalgias and placed on Nexletol by PCP.  Blood pressure today 142/88.  Denies new or worsening stroke/TIA symptoms.  Initial visit 06/26/2019 JM: Ms. Hahne is being seen today for hospital follow-up.  Residual stroke deficits mild left finger numbness which can worsen with temperature change but overall improving.  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  She does report high stress with her job position and increased stress when her headache started  approximately 4 months ago.  History of migraines with occasional visual auras but has not experienced migraine headache in numerous years.  She was referred to headache wellness clinic and saw Dr. Margie Sheller where she received trigger point injections and trialed Robaxin , baclofen  and Fioricet without much benefit.  She had previously been on Topamax and beta-blockers for preventative migraine treatment but difficulty tolerating.  She has trialed abortive therapies in the past but had complications.  She is in the process of initiating Aimovig with Dr. Margie Sheller but has not yet started.  Current headaches typically start with bilateral neck tension and then experience pain left temporal and frontal region.  These headaches will be associated with photophobia and phonophobia.  She is planning on undergoing HST tomorrow which was ordered by her PCP to assess for potential sleep apnea.  She has completed 3 weeks DAPT and continues on Plavix  alone without bleeding or bruising.  Continues on atorvastatin  without myalgias.  Does plan on having follow-up with PCP after HST completed and plans on doing lab work at that time.  Blood pressure today satisfactory 122/69.  No further concerns at this time.  Stroke admission 05/26/2019: Ms. SERRENA LINDERMAN is a 63 y.o. female with history of Htn and migraines  presented with multiple transient episodes of perioral numbness, left-sided face arm and leg numbness that lasted for a few minutes followed by headache.   Stroke work-up completed with presenting symptoms possibly secondary to migraines versus stroke with questionable tiny acute infarction along the lateral right thalamus as evidenced on MRI secondary to small vessel disease.  Carotid Dopplers unremarkable.  2D echo normal EF without cardiac source of embolus identified.  LDL 175.  A1c 5.7.  Recommended DAPT for 3 weeks and Plavix  alone as previously on aspirin .  HTN stable.  Initiated atorvastatin  80 mg daily for HLD  management.  Other stroke risk factors include advanced age, former tobacco use, EtOH use, obesity and history of migraines.  Discharged home in stable condition without therapy needs.    ROS:   14 system review of systems performed and negative with exception of those listed in HPI  PMH:  Past Medical History:  Diagnosis Date   Anxiety    Arthritis    Dysrhythmia    Family history of breast cancer    Family history of colon cancer    History of kidney stones    Hypertension    Migraines    Pneumonia    Sleep apnea    Stroke (HCC)    Tuberculosis    test positive, never had    PSH:  Past Surgical History:  Procedure Laterality Date   ABDOMINAL HYSTERECTOMY     COLONOSCOPY WITH PROPOFOL  N/A 01/20/2023   Procedure: COLONOSCOPY WITH PROPOFOL ;  Surgeon:  Rourk, Windsor Hatcher, MD;  Location: [facility];  Service: Endoscopy;  Laterality: N/A;  9:00 am   CYST EXCISION     left knee   KNEE ARTHROSCOPY Left    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   Procedure: TOTAL KNEE ARTHROPLASTY;  Surgeon: Murleen Arms, MD;  Location: [facility];  Service: Orthopedics;  Laterality: Left;   WISDOM TOOTH EXTRACTION      Social History:  Social History   Socioeconomic History   Marital status: Divorced    Spouse name: Not on file   Number of children: Not on file   Years of education: Not on file   Highest education level: Not on file  Occupational History   Not on file  Tobacco Use   Smoking status: Former    Current packs/day: 0.00    Average packs/day: 0.3 packs/day for 5.0 years (1.3 ttl pk-yrs)    Types: Cigarettes    Start date: 01/02/2005    Quit date: 01/02/2010    Years since quitting: 14.0   Smokeless tobacco: Never  Vaping Use   Vaping status: Never Used  Substance and Sexual Activity   Alcohol  use: Not Currently   Drug use: No   Sexual activity: Not on file  Other Topics Concern   Not on file  Social History Narrative   Not on file   Social Drivers of Health    Financial Resource Strain: Not on file  Food Insecurity: Not on file  Transportation Needs: Not on file  Physical Activity: Not on file  Stress: Not on file  Social Connections: Unknown (06/14/2023)   [facility]   Social Network    Social Network: Not on file  Intimate Partner Violence: Unknown (06/14/2023)   Received from [facility]   HITS    Physically Hurt: Not on file    Insult or Talk Down To: Not on file    Threaten Physical Harm: Not on file    Scream or Curse: Not on file    Family History:  Family History  Problem Relation Age of Onset   Heart failure Mother    Heart failure Father    Colon cancer Father 26   Colon cancer Brother 40   Colon cancer Maternal Grandfather    Colon cancer Son 76   Hypertension Other    Breast cancer Cousin        dx. > 50, mat first cousin    Medications:   Current Outpatient Medications on File Prior to Visit  Medication Sig Dispense Refill   baclofen  (LIORESAL ) 10 MG tablet Take 1 tablet (10 mg total) by mouth 2 (two) times daily as needed (migraine headaches). 180 each 1   Bempedoic Acid (NEXLETOL) 180 MG TABS Take 180 mg by mouth at bedtime.     CALCIUM  PO Take 1 tablet by mouth daily.     Cholecalciferol (VITAMIN D3 PO) Take 1 tablet by mouth daily.     clopidogrel  (PLAVIX ) 75 MG tablet Take 1 tablet (75 mg total) by mouth daily.     Coenzyme Q10 (COQ10) 200 MG CAPS Take 200 mg by mouth daily.      FLUoxetine HCl 60 MG TABS Take 60 mg by mouth daily.     gabapentin (NEURONTIN) 300 MG capsule Take 600 mg by mouth 2 (two) times daily.     hydrochlorothiazide (MICROZIDE) 12.5 MG capsule Take 12.5 mg by mouth daily.     losartan (COZAAR) 50 MG tablet Take 50  mg by mouth daily.     MAGNESIUM PO Take 1 tablet by mouth daily.     Omega-3 Fatty Acids (FISH OIL PO) Take 1 capsule by mouth 2 (two) times daily.     propranolol  ER (INDERAL  LA) 60 MG 24 hr capsule Take 1 capsule (60 mg total) by mouth daily. 90  capsule 3   Rimegepant Sulfate (NURTEC) 75 MG TBDP Take 1 tablet (75 mg total) by mouth daily as needed (For migraine abortive therapy). 8 tablet 11   rosuvastatin  (CRESTOR ) 20 MG tablet TAKE ONE TABLET (20MG  TOTAL) BY MOUTH DAILY 90 tablet 3   traZODone (DESYREL) 50 MG tablet Take 50 mg by mouth at bedtime.     No current facility-administered medications on file prior to visit.    Allergies:   Allergies  Allergen Reactions   Chocolate Other (See Comments)    Consuming a little more than usual can trigger a migraine   Citrus Other (See Comments)    Consuming a little more than usual can trigger a migraine   Codeine Other (See Comments)    Headache   Lipitor  [Atorvastatin ] Other (See Comments)    Joint pain   Nitrates, Organic Other (See Comments)    Consuming a little more than usual can trigger a migraine   Peanut-Containing Drug Products Other (See Comments)    Consuming a little more than usual can trigger a migraine   Pineapple Other (See Comments)    Consuming a little more than usual can trigger a migraine     Physical Exam  Vitals:   01/19/24 0809  BP: 129/84  Pulse: 78  Weight: 223 lb (101.2 kg)  Height: 6' (1.829 m)    Body mass index is 30.24 kg/m. No results found.  General: well developed, well nourished, very pleasant middle-age Caucasian female, seated, in no evident distress  Neurologic Exam Mental Status: Awake and fully alert.   Fluent speech and language. Oriented to place and time. Recent memory mildly impaired and remote memory intact. Attention span, concentration and fund of knowledge appropriate. Mood and affect appropriate.  Cranial Nerves: Pupils equal, briskly reactive to light. Extraocular movements full without nystagmus. Visual fields full to confrontation. Hearing intact. Facial sensation intact. Face, tongue, palate moves normally and symmetrically.  Motor: Normal bulk and tone. Normal strength in all tested extremity muscles. Sensory.:   Intact to light touch, vibratory and pinprick sensation Coordination: Rapid alternating movements normal in all extremities. Finger-to-nose and heel-to-shin performed accurately bilaterally. Gait and Station: Arises from chair without difficulty. Stance is normal. Gait demonstrates normal stride length and balance without use of assistive device Reflexes: 1+ and symmetric; Toes downgoing.        ASSESSMENT/PLAN: HERLINDA HEADY is a 63 y.o. year old female with history of transient left-sided numbness 05/2019 potentially related to complicated migraine vs small right thalamic stroke secondary to small vessel disease.  She complained of dizziness/unsteadiness in beginning of 2024 (see below). Vascular risk factors include HTN, HLD, migraines and OSA on CPAP.      Hx of right thalamic stroke:  Continue Plavix  and Crestor  and Nexletol for secondary stroke prevention and per cardiology recommendations Discussed secondary stroke prevention and importance of close PCP follow-up for aggressive stroke risk factor management including BP goal<130/90, HLD with LDL goal<70 and pre-DM with A1c.<7   Migraines with aura, chronic:  Remains well controlled continue propranolol  ER 60 mg daily - refill provided Continue baclofen  10mg  BID PRN and Nurtec 75mg  PRN for  abortive therapy.   Continue gabapentin 300 mg twice daily managed by psychiatry Previously tried/failed: Propranolol , gabapentin, Nurtec, Ubrelvy, triptans contraindicated d/t stroke hx  Dizziness unsteadiness Present since beginning of 2024 Referral placed to ENT for further evaluation, unclear if underlying sinus issues contributing.  Also has occasional positional symptoms and discussed ways to help with this, recommend follow-up with PCP for further evaluation regarding positional dizziness Could consider EEG if symptoms persist MRI brain 11/2022 and 11/2023 no acute abnormalities or concerning findings contributing to dizziness CTA  head/neck 07/2023 minimal extracranial atherosclerosis and minimal intracranial arthrosclerosis Lab work unremarkable     Follow-up in 7 months or call earlier if needed    CC:  Kathyleen Parkins, MD    I spent a prolonged 45 minutes of face-to-face and non-face-to-face time with patient.  This included previsit chart review, lab review, study review, order entry, electronic health record documentation, patient education and discussion regarding above diagnoses and treatment plan and answered all other questions to patient's satisfaction  Johny Nap, [facility]  [facility] [address] [place], [place] [address]  Phone [phone] Fax [phone] Note: This document was prepared with digital dictation and possible smart phrase technology. Any transcriptional errors that result from this process are unintentional.

## 2024-01-19 ENCOUNTER — Encounter: Payer: Self-pay | Admitting: Adult Health

## 2024-01-19 ENCOUNTER — Ambulatory Visit: Payer: No Typology Code available for payment source | Admitting: Adult Health

## 2024-01-19 VITALS — BP 129/84 | HR 78 | Ht 72.0 in | Wt 223.0 lb

## 2024-01-19 DIAGNOSIS — R0981 Nasal congestion: Secondary | ICD-10-CM

## 2024-01-19 DIAGNOSIS — I6381 Other cerebral infarction due to occlusion or stenosis of small artery: Secondary | ICD-10-CM

## 2024-01-19 DIAGNOSIS — R42 Dizziness and giddiness: Secondary | ICD-10-CM | POA: Diagnosis not present

## 2024-01-19 DIAGNOSIS — G43009 Migraine without aura, not intractable, without status migrainosus: Secondary | ICD-10-CM | POA: Diagnosis not present

## 2024-01-19 MED ORDER — NURTEC 75 MG PO TBDP: 75.0000 mg | ORAL_TABLET | Freq: Every day | ORAL | 11 refills | Status: DC | PRN

## 2024-01-19 MED ORDER — BACLOFEN 10 MG PO TABS: 10.0000 mg | ORAL_TABLET | Freq: Two times a day (BID) | ORAL | 1 refills | Status: DC | PRN

## 2024-01-19 MED ORDER — PROPRANOLOL HCL ER 60 MG PO CP24: 60.0000 mg | ORAL_CAPSULE | Freq: Every day | ORAL | 3 refills | Status: DC

## 2024-01-19 NOTE — Patient Instructions (Addendum)
 Continue propranolol  LA 60mg  daily for migraine prevention  Continue Nurtec as needed  Referral placed to ENT for further evaluation of dizziness and sinus issues   Continue plavix  and crestor  for secondary stroke prevention measures       Followup in the future with me in 7 months or call earlier if needed       Thank you for coming to see us  at [facility]. I hope we have been able to provide you high quality care today.  You may receive a patient satisfaction survey over the next few weeks. We would appreciate your feedback and comments so that we may continue to improve ourselves and the health of our patients.

## 2024-01-27 ENCOUNTER — Other Ambulatory Visit (HOSPITAL_COMMUNITY): Payer: Self-pay

## 2024-01-27 ENCOUNTER — Telehealth: Payer: Self-pay

## 2024-01-27 NOTE — Telephone Encounter (Signed)
 Pharmacy Patient Advocate Encounter   Received notification from CoverMyMeds that prior authorization for Nurtec 75MG  dispersible tablets is required/requested.   Insurance verification completed.   The patient is insured through [employer] .   Per test claim: PA required; PA submitted to above mentioned insurance via CoverMyMeds Key/confirmation #/[id number] [facility] Status is pending

## 2024-01-28 ENCOUNTER — Other Ambulatory Visit (HOSPITAL_COMMUNITY): Payer: Self-pay

## 2024-01-28 NOTE — Telephone Encounter (Signed)
 Pharmacy Patient Advocate Encounter  Received notification from [facility] that Prior Authorization for Nurtec 75MG  dispersible tablets has been APPROVED from 01/27/2024 to 01/26/2025. Ran test claim, Copay is $0. This test claim was processed through [facility]- copay amounts may vary at other pharmacies due to pharmacy/plan contracts, or as the patient moves through the different stages of their insurance plan.   PA #/Case ID/Reference #: Rx #: [id number]

## 2024-03-15 ENCOUNTER — Other Ambulatory Visit (HOSPITAL_COMMUNITY): Payer: Self-pay | Admitting: Nephrology

## 2024-03-15 DIAGNOSIS — I129 Hypertensive chronic kidney disease with stage 1 through stage 4 chronic kidney disease, or unspecified chronic kidney disease: Secondary | ICD-10-CM

## 2024-03-15 DIAGNOSIS — N1832 Chronic kidney disease, stage 3b: Secondary | ICD-10-CM

## 2024-03-17 ENCOUNTER — Ambulatory Visit (HOSPITAL_COMMUNITY)
Admission: RE | Admit: 2024-03-17 | Discharge: 2024-03-17 | Disposition: A | Discharge status: Home / Self Care | Source: Ambulatory Visit | Attending: Nephrology | Admitting: Nephrology

## 2024-03-17 DIAGNOSIS — N1832 Chronic kidney disease, stage 3b: Secondary | ICD-10-CM | POA: Diagnosis present

## 2024-03-17 DIAGNOSIS — I129 Hypertensive chronic kidney disease with stage 1 through stage 4 chronic kidney disease, or unspecified chronic kidney disease: Secondary | ICD-10-CM | POA: Diagnosis present

## 2024-03-31 DEATH — deceased

## 2024-04-27 ENCOUNTER — Institutional Professional Consult (permissible substitution) (INDEPENDENT_AMBULATORY_CARE_PROVIDER_SITE_OTHER): Admitting: Otolaryngology

## 2024-08-16 ENCOUNTER — Ambulatory Visit: Admitting: Adult Health
